# Patient Record
Sex: Female | Born: 1960 | Race: Black or African American | Hispanic: No | Marital: Married | Smoking: Never smoker
Health system: Southern US, Community
[De-identification: ages and names within clinical notes are randomized; demographics above are authoritative.]

## PROBLEM LIST (undated history)

## (undated) DIAGNOSIS — Z87442 Personal history of urinary calculi: Secondary | ICD-10-CM

## (undated) DIAGNOSIS — K219 Gastro-esophageal reflux disease without esophagitis: Secondary | ICD-10-CM

## (undated) DIAGNOSIS — I10 Essential (primary) hypertension: Secondary | ICD-10-CM

## (undated) HISTORY — PX: ABDOMINAL HYSTERECTOMY: SHX81

---

## 1996-08-13 HISTORY — PX: UVULOPALATOPHARYNGOPLASTY: SHX827

## 2002-04-17 ENCOUNTER — Ambulatory Visit (HOSPITAL_COMMUNITY): Admission: RE | Admit: 2002-04-17 | Discharge: 2002-04-17 | Payer: Self-pay | Admitting: *Deleted

## 2002-04-17 ENCOUNTER — Encounter: Payer: Self-pay | Admitting: *Deleted

## 2004-05-24 ENCOUNTER — Ambulatory Visit: Payer: Self-pay | Admitting: Internal Medicine

## 2004-05-26 ENCOUNTER — Ambulatory Visit: Payer: Self-pay | Admitting: Internal Medicine

## 2008-01-27 ENCOUNTER — Ambulatory Visit: Payer: Self-pay

## 2010-08-30 ENCOUNTER — Ambulatory Visit: Payer: Self-pay | Admitting: Obstetrics and Gynecology

## 2010-09-04 ENCOUNTER — Inpatient Hospital Stay: Payer: Self-pay | Admitting: Obstetrics and Gynecology

## 2010-09-05 LAB — PATHOLOGY REPORT

## 2010-12-16 ENCOUNTER — Emergency Department: Payer: Self-pay | Admitting: Unknown Physician Specialty

## 2015-09-22 ENCOUNTER — Other Ambulatory Visit: Payer: Self-pay | Admitting: Family Medicine

## 2015-09-22 DIAGNOSIS — Z1231 Encounter for screening mammogram for malignant neoplasm of breast: Secondary | ICD-10-CM

## 2015-10-03 ENCOUNTER — Ambulatory Visit
Admission: RE | Admit: 2015-10-03 | Discharge: 2015-10-03 | Disposition: A | Discharge status: Home / Self Care | Payer: BLUE CROSS/BLUE SHIELD | Source: Ambulatory Visit | Attending: Family Medicine | Admitting: Family Medicine

## 2015-10-03 DIAGNOSIS — Z1231 Encounter for screening mammogram for malignant neoplasm of breast: Secondary | ICD-10-CM | POA: Diagnosis present

## 2015-11-07 DIAGNOSIS — K219 Gastro-esophageal reflux disease without esophagitis: Secondary | ICD-10-CM | POA: Insufficient documentation

## 2016-01-12 ENCOUNTER — Ambulatory Visit
Admission: RE | Admit: 2016-01-12 | Payer: BLUE CROSS/BLUE SHIELD | Source: Ambulatory Visit | Admitting: Unknown Physician Specialty

## 2016-01-12 ENCOUNTER — Encounter: Admission: RE | Payer: Self-pay | Source: Ambulatory Visit

## 2016-01-12 SURGERY — COLONOSCOPY WITH PROPOFOL
Anesthesia: General

## 2016-09-24 DIAGNOSIS — K8020 Calculus of gallbladder without cholecystitis without obstruction: Secondary | ICD-10-CM | POA: Insufficient documentation

## 2016-09-24 DIAGNOSIS — I10 Essential (primary) hypertension: Secondary | ICD-10-CM | POA: Insufficient documentation

## 2016-09-28 ENCOUNTER — Other Ambulatory Visit: Payer: Self-pay | Admitting: Internal Medicine

## 2016-09-28 DIAGNOSIS — Z1231 Encounter for screening mammogram for malignant neoplasm of breast: Secondary | ICD-10-CM

## 2016-10-23 ENCOUNTER — Ambulatory Visit: Payer: BLUE CROSS/BLUE SHIELD

## 2016-11-05 ENCOUNTER — Ambulatory Visit: Payer: BLUE CROSS/BLUE SHIELD

## 2016-11-07 ENCOUNTER — Ambulatory Visit: Payer: BLUE CROSS/BLUE SHIELD

## 2016-11-28 ENCOUNTER — Ambulatory Visit
Admission: RE | Admit: 2016-11-28 | Discharge: 2016-11-28 | Disposition: A | Discharge status: Home / Self Care | Payer: BLUE CROSS/BLUE SHIELD | Source: Ambulatory Visit | Attending: Internal Medicine | Admitting: Internal Medicine

## 2016-11-28 DIAGNOSIS — Z1231 Encounter for screening mammogram for malignant neoplasm of breast: Secondary | ICD-10-CM | POA: Insufficient documentation

## 2017-04-02 DIAGNOSIS — R7309 Other abnormal glucose: Secondary | ICD-10-CM | POA: Insufficient documentation

## 2018-01-08 ENCOUNTER — Other Ambulatory Visit: Payer: Self-pay | Admitting: Internal Medicine

## 2018-01-08 DIAGNOSIS — Z1231 Encounter for screening mammogram for malignant neoplasm of breast: Secondary | ICD-10-CM

## 2018-01-16 ENCOUNTER — Ambulatory Visit
Admission: RE | Admit: 2018-01-16 | Discharge: 2018-01-16 | Disposition: A | Discharge status: Home / Self Care | Payer: BLUE CROSS/BLUE SHIELD | Source: Ambulatory Visit | Attending: Internal Medicine | Admitting: Internal Medicine

## 2018-01-16 DIAGNOSIS — Z1231 Encounter for screening mammogram for malignant neoplasm of breast: Secondary | ICD-10-CM | POA: Diagnosis not present

## 2018-04-02 ENCOUNTER — Other Ambulatory Visit: Payer: Self-pay | Admitting: Internal Medicine

## 2018-04-02 DIAGNOSIS — R1013 Epigastric pain: Secondary | ICD-10-CM

## 2018-04-02 DIAGNOSIS — Z0000 Encounter for general adult medical examination without abnormal findings: Secondary | ICD-10-CM | POA: Insufficient documentation

## 2018-04-02 DIAGNOSIS — Z9989 Dependence on other enabling machines and devices: Secondary | ICD-10-CM | POA: Insufficient documentation

## 2018-04-08 ENCOUNTER — Ambulatory Visit: Payer: BLUE CROSS/BLUE SHIELD

## 2018-04-17 ENCOUNTER — Ambulatory Visit: Payer: BLUE CROSS/BLUE SHIELD

## 2018-04-22 ENCOUNTER — Ambulatory Visit
Admission: RE | Admit: 2018-04-22 | Discharge: 2018-04-22 | Disposition: A | Discharge status: Home / Self Care | Payer: BLUE CROSS/BLUE SHIELD | Source: Ambulatory Visit | Attending: Internal Medicine | Admitting: Internal Medicine

## 2018-04-22 DIAGNOSIS — K8070 Calculus of gallbladder and bile duct without cholecystitis without obstruction: Secondary | ICD-10-CM | POA: Diagnosis not present

## 2018-04-22 DIAGNOSIS — R932 Abnormal findings on diagnostic imaging of liver and biliary tract: Secondary | ICD-10-CM | POA: Insufficient documentation

## 2018-04-22 DIAGNOSIS — R1013 Epigastric pain: Secondary | ICD-10-CM | POA: Diagnosis present

## 2018-04-23 ENCOUNTER — Other Ambulatory Visit: Payer: Self-pay

## 2018-04-24 ENCOUNTER — Other Ambulatory Visit: Payer: Self-pay

## 2018-04-24 ENCOUNTER — Telehealth: Payer: Self-pay | Admitting: Gastroenterology

## 2018-04-24 NOTE — Telephone Encounter (Signed)
Pt was told she was having surgery & now she isn't. Wants to speak to a nurse asap

## 2018-04-24 NOTE — Telephone Encounter (Signed)
Patty from [facility] needs a call regarding pt ASAP  [phone]

## 2018-04-25 ENCOUNTER — Other Ambulatory Visit
Admission: RE | Admit: 2018-04-25 | Discharge: 2018-04-25 | Disposition: A | Discharge status: Home / Self Care | Payer: BLUE CROSS/BLUE SHIELD | Source: Ambulatory Visit | Attending: Gastroenterology | Admitting: Gastroenterology

## 2018-04-25 ENCOUNTER — Other Ambulatory Visit: Payer: Self-pay

## 2018-04-25 DIAGNOSIS — K8021 Calculus of gallbladder without cholecystitis with obstruction: Secondary | ICD-10-CM | POA: Diagnosis present

## 2018-04-25 LAB — HEPATIC FUNCTION PANEL
ALT: 33 U/L (ref 0–44)
AST: 28 U/L (ref 15–41)
Albumin: 3.8 g/dL (ref 3.5–5.0)
Alkaline Phosphatase: 66 U/L (ref 38–126)
Bilirubin, Direct: <0.1 mg/dL (ref 0.0–0.2)
Total Bilirubin: 1 mg/dL (ref 0.3–1.2)
Total Protein: 7.5 g/dL (ref 6.5–8.1)

## 2018-04-28 ENCOUNTER — Ambulatory Visit
Admission: RE | Admit: 2018-04-28 | Discharge: 2018-04-28 | Disposition: A | Discharge status: Home / Self Care | Payer: BLUE CROSS/BLUE SHIELD | Source: Ambulatory Visit | Attending: Gastroenterology | Admitting: Gastroenterology

## 2018-04-28 ENCOUNTER — Encounter
Admission: RE | Disposition: A | Discharge status: Home / Self Care | Payer: Self-pay | Source: Ambulatory Visit | Attending: Gastroenterology

## 2018-04-28 ENCOUNTER — Ambulatory Visit: Payer: BLUE CROSS/BLUE SHIELD | Admitting: Anesthesiology

## 2018-04-28 ENCOUNTER — Ambulatory Visit: Payer: BLUE CROSS/BLUE SHIELD

## 2018-04-28 DIAGNOSIS — Z79899 Other long term (current) drug therapy: Secondary | ICD-10-CM | POA: Diagnosis not present

## 2018-04-28 DIAGNOSIS — I10 Essential (primary) hypertension: Secondary | ICD-10-CM | POA: Diagnosis not present

## 2018-04-28 DIAGNOSIS — Z6841 Body Mass Index (BMI) 40.0 and over, adult: Secondary | ICD-10-CM | POA: Diagnosis not present

## 2018-04-28 DIAGNOSIS — K838 Other specified diseases of biliary tract: Secondary | ICD-10-CM | POA: Diagnosis not present

## 2018-04-28 DIAGNOSIS — K8021 Calculus of gallbladder without cholecystitis with obstruction: Secondary | ICD-10-CM

## 2018-04-28 DIAGNOSIS — R932 Abnormal findings on diagnostic imaging of liver and biliary tract: Secondary | ICD-10-CM | POA: Diagnosis present

## 2018-04-28 DIAGNOSIS — Z87891 Personal history of nicotine dependence: Secondary | ICD-10-CM | POA: Insufficient documentation

## 2018-04-28 DIAGNOSIS — K219 Gastro-esophageal reflux disease without esophagitis: Secondary | ICD-10-CM | POA: Diagnosis not present

## 2018-04-28 HISTORY — PX: ERCP: SHX5425

## 2018-04-28 HISTORY — DX: Personal history of urinary calculi: Z87.442

## 2018-04-28 HISTORY — DX: Essential (primary) hypertension: I10

## 2018-04-28 HISTORY — DX: Gastro-esophageal reflux disease without esophagitis: K21.9

## 2018-04-28 SURGERY — ERCP, WITH INTERVENTION IF INDICATED
Anesthesia: General

## 2018-04-28 MED ORDER — FENTANYL CITRATE (PF) 100 MCG/2ML IJ SOLN: 25.0000 ug | INTRAMUSCULAR | Status: DC | PRN

## 2018-04-28 MED ORDER — MIDAZOLAM HCL 2 MG/2ML IJ SOLN
INTRAMUSCULAR | Status: DC | PRN
  Administered 2018-04-28: 2 mg via INTRAVENOUS

## 2018-04-28 MED ORDER — FENTANYL CITRATE (PF) 100 MCG/2ML IJ SOLN
INTRAMUSCULAR | Status: DC | PRN
  Administered 2018-04-28: 100 ug via INTRAVENOUS

## 2018-04-28 MED ORDER — LIDOCAINE HCL (CARDIAC) PF 100 MG/5ML IV SOSY
PREFILLED_SYRINGE | INTRAVENOUS | Status: DC | PRN
  Administered 2018-04-28: 100 mg via INTRAVENOUS

## 2018-04-28 MED ORDER — ONDANSETRON HCL 4 MG/2ML IJ SOLN: 4.0000 mg | Freq: Once | INTRAMUSCULAR | Status: DC | PRN

## 2018-04-28 MED ORDER — SODIUM CHLORIDE 0.9 % IV SOLN
INTRAVENOUS | Status: DC
  Administered 2018-04-28: 13:00:00 via INTRAVENOUS

## 2018-04-28 MED ORDER — GLYCOPYRROLATE 0.2 MG/ML IJ SOLN
INTRAMUSCULAR | Status: DC | PRN
  Administered 2018-04-28: 0.2 mg via INTRAVENOUS

## 2018-04-28 MED ORDER — INDOMETHACIN 50 MG RE SUPP
RECTAL | Status: AC
  Administered 2018-04-28: 100 mg via RECTAL
  Filled 2018-04-28: qty 2

## 2018-04-28 MED ORDER — MIDAZOLAM HCL 2 MG/2ML IJ SOLN
INTRAMUSCULAR | Status: AC
  Filled 2018-04-28: qty 2

## 2018-04-28 MED ORDER — INDOMETHACIN 50 MG RE SUPP
100.0000 mg | Freq: Once | RECTAL | Status: AC
  Administered 2018-04-28: 100 mg via RECTAL

## 2018-04-28 MED ORDER — ROCURONIUM BROMIDE 50 MG/5ML IV SOLN
INTRAVENOUS | Status: AC
  Filled 2018-04-28: qty 1

## 2018-04-28 MED ORDER — FENTANYL CITRATE (PF) 100 MCG/2ML IJ SOLN
INTRAMUSCULAR | Status: AC
  Filled 2018-04-28: qty 2

## 2018-04-28 MED ORDER — ROCURONIUM BROMIDE 100 MG/10ML IV SOLN
INTRAVENOUS | Status: DC | PRN
  Administered 2018-04-28: 50 mg via INTRAVENOUS

## 2018-04-28 MED ORDER — LIDOCAINE HCL (PF) 2 % IJ SOLN
INTRAMUSCULAR | Status: AC
  Filled 2018-04-28: qty 10

## 2018-04-28 MED ORDER — PROPOFOL 10 MG/ML IV BOLUS
INTRAVENOUS | Status: DC | PRN
  Administered 2018-04-28: 150 mg via INTRAVENOUS

## 2018-04-28 MED ORDER — ONDANSETRON HCL 4 MG/2ML IJ SOLN
INTRAMUSCULAR | Status: DC | PRN
  Administered 2018-04-28: 4 mg via INTRAVENOUS

## 2018-04-28 MED ORDER — SUGAMMADEX SODIUM 200 MG/2ML IV SOLN
INTRAVENOUS | Status: DC | PRN
  Administered 2018-04-28: 250 mg via INTRAVENOUS

## 2018-04-28 NOTE — Anesthesia Preprocedure Evaluation (Addendum)
Anesthesia Evaluation  Patient identified by MRN, date of birth, ID band Patient awake    Reviewed: Allergy & Precautions, NPO status , Patient's Chart, lab work & pertinent test results, reviewed documented beta blocker date and time   Airway Mallampati: II  TM Distance: >3 FB     Dental  (+) Chipped   Pulmonary           Cardiovascular hypertension,      Neuro/Psych    GI/Hepatic GERD  Controlled,  Endo/Other  Morbid obesity  Renal/GU      Musculoskeletal   Abdominal   Peds  Hematology   Anesthesia Other Findings   Reproductive/Obstetrics                            Anesthesia Physical Anesthesia Plan  ASA: III  Anesthesia Plan: General   Post-op Pain Management:    Induction: Intravenous  PONV Risk Score and Plan:   Airway Management Planned:   Additional Equipment:   Intra-op Plan:   Post-operative Plan:   Informed Consent: I have reviewed the patients History and Physical, chart, labs and discussed the procedure including the risks, benefits and alternatives for the proposed anesthesia with the patient or authorized representative who has indicated his/her understanding and acceptance.     Plan Discussed with: CRNA  Anesthesia Plan Comments:         Anesthesia Quick Evaluation

## 2018-04-28 NOTE — Transfer of Care (Signed)
Immediate Anesthesia Transfer of Care Note  Patient: Anita Wade  Procedure(s) Performed: ENDOSCOPIC RETROGRADE CHOLANGIOPANCREATOGRAPHY (ERCP) (N/A )  Patient Location: PACU  Anesthesia Type:General  Level of Consciousness: awake and patient cooperative  Airway & Oxygen Therapy: Patient Spontanous Breathing and Patient connected to nasal cannula oxygen  Post-op Assessment: Report given to RN and Post -op Vital signs reviewed and stable  Post vital signs: Reviewed and stable  Last Vitals:  Vitals Value Taken Time  BP    Temp    Pulse 100 04/28/2018  2:22 PM  Resp    SpO2 100 % 04/28/2018  2:22 PM  Vitals shown include unvalidated device data.  Last Pain:  Vitals:   04/28/18 1219  TempSrc: Tympanic  PainSc: 0-No pain         Complications: No apparent anesthesia complications

## 2018-04-28 NOTE — Interval H&P Note (Signed)
History and Physical Interval Note:  04/28/2018 2:54 PM  Milus MallickDaphne G Wade  has presented today for surgery, with the diagnosis of Common bile duct obstruction K83.1  The various methods of treatment have been discussed with the patient and family. After consideration of risks, benefits and other options for treatment, the patient has consented to  Procedure(s): ENDOSCOPIC RETROGRADE CHOLANGIOPANCREATOGRAPHY (ERCP) (N/A) as a surgical intervention .  The patient's history has been reviewed, patient examined, no change in status, stable for surgery.  I have reviewed the patient's chart and labs.  Questions were answered to the patient's satisfaction.     Darren FedExWohl

## 2018-04-28 NOTE — Anesthesia Postprocedure Evaluation (Signed)
Anesthesia Post Note  Patient: Anita MallickDaphne G Shook  Procedure(s) Performed: ENDOSCOPIC RETROGRADE CHOLANGIOPANCREATOGRAPHY (ERCP) (N/A )  Patient location during evaluation: Endoscopy Anesthesia Type: General Level of consciousness: awake and alert Pain management: pain level controlled Vital Signs Assessment: post-procedure vital signs reviewed and stable Respiratory status: spontaneous breathing, nonlabored ventilation, respiratory function stable and patient connected to nasal cannula oxygen Cardiovascular status: blood pressure returned to baseline and stable Postop Assessment: no apparent nausea or vomiting Anesthetic complications: no     Last Vitals:  Vitals:   04/28/18 1452 04/28/18 1453  BP:  (!) 152/97  Pulse: 81 76  Resp: 20 17  Temp: (!) 36.3 C   SpO2:  100%    Last Pain:  Vitals:   04/28/18 1453  TempSrc:   PainSc: 0-No pain                 THOMAS,MATHAI S

## 2018-04-28 NOTE — Anesthesia Post-op Follow-up Note (Signed)
Anesthesia QCDR form completed.        

## 2018-04-28 NOTE — Op Note (Signed)
[facility] Gastroenterology Patient Name: Anita Wade Procedure Date: 04/28/2018 1:03 PM MRN: [id number] Account #: [id number] Date of Birth: March 28, 1961 Admit Type: Outpatient Age: 57 Room: [facility] ENDO ROOM 4 Gender: Female Note Status: Finalized Procedure:            ERCP Indications:          Abnormal MRCP with 1.3 cm duct and 9mm stone. Providers:            Midge Minium MD, MD Referring MD:         Marya Amsler. Dareen Piano MD, MD (Referring MD) Medicines:            General Anesthesia Complications:        No immediate complications. Procedure:            Pre-Anesthesia Assessment:                       - Prior to the procedure, a History and Physical was                        performed, and patient medications and allergies were                        reviewed. The patient's tolerance of previous                        anesthesia was also reviewed. The risks and benefits of                        the procedure and the sedation options and risks were                        discussed with the patient. All questions were                        answered, and informed consent was obtained. Prior                        Anticoagulants: The patient has taken no previous                        anticoagulant or antiplatelet agents. ASA Grade                        Assessment: II - A patient with mild systemic disease.                        After reviewing the risks and benefits, the patient was                        deemed in satisfactory condition to undergo the                        procedure.                       After obtaining informed consent, the scope was passed                        under direct vision. Throughout the procedure, the  patient's blood pressure, pulse, and oxygen saturations                        were monitored continuously. The Duodenoscope was                        introduced through the mouth, and used to inject                    contrast into and used to inject contrast into the bile                        duct and ventral pancreatic duct. The ERCP was                        accomplished without difficulty. The patient tolerated                        the procedure well. Findings:      The scout film was normal. The esophagus was successfully intubated       under direct vision. The scope was advanced to a normal major papilla in       the descending duodenum without detailed examination of the pharynx,       larynx and associated structures, and upper GI tract. The upper GI tract       was grossly normal. The bile duct was deeply cannulated with the       short-nosed traction sphincterotome. Contrast was injected. I personally       interpreted the pancreatic duct images. There was brisk flow of contrast       through the ducts. Image quality was excellent. The entire opacified       area was normal. The bile duct was deeply cannulated with the       short-nosed traction sphincterotome. Contrast was injected. The main       bile duct was normal. A wire was passed into the biliary tree. Biliary       sphincterotomy was made with a traction (standard) sphincterotome using       ERBE electrocautery. There was no post-sphincterotomy bleeding. The       biliary tree was swept with a 15 mm balloon starting at the bifurcation.       Sludge was swept from the duct. Impression:           - A biliary sphincterotomy was performed.                       - The biliary tree was swept and sludge was found.                       - CBD 4 mm.                       - PD 2 mm. Recommendation:       - Discharge patient to home.                       - Clear liquid diet today. Procedure Code(s):    --- Professional ---                       [phone] [id number], Endoscopic retrograde cholangiopancreatography                        (  ERCP); with removal of calculi/debris from                        biliary/pancreatic duct(s)                        [phone], Endoscopic retrograde cholangiopancreatography                        (ERCP); with sphincterotomy/papillotomy                       [phone], Endoscopic catheterization of the pancreatic                        ductal system, radiological supervision and                        interpretation Diagnosis Code(s):    --- Professional ---                       R93.2, Abnormal findings on diagnostic imaging of liver                        and biliary tract CPT copyright 2017 American Medical Association. All rights reserved. The codes documented in this report are preliminary and upon coder review may  be revised to meet current compliance requirements. Midge Minium MD, MD 04/28/2018 2:08:49 PM This report has been signed electronically. Number of Addenda: 0 Note Initiated On: 04/28/2018 1:03 PM      [facility]

## 2018-04-28 NOTE — H&P (Signed)
Midge Minium, MD [facility] [address], [address] [place], [place] [address] Phone:[phone] Fax : [phone]  Primary Care Physician:  Lauro Regulus, MD Primary Gastroenterologist:  Dr. Servando Snare  Pre-Procedure History & Physical: HPI:  Anita Wade is a 57 y.o. female is here for an ERCP.   Past Medical History:  Diagnosis Date  . GERD (gastroesophageal reflux disease)   . History of kidney stones   . Hypertension     Past Surgical History:  Procedure Laterality Date  . ABDOMINAL HYSTERECTOMY     2012  . UVULOPALATOPHARYNGOPLASTY  1998    Prior to Admission medications   Medication Sig Start Date End Date Taking? Authorizing Provider  acetaminophen (TYLENOL) 650 MG CR tablet Take 650 mg by mouth every 8 (eight) hours as needed for pain.   Yes [provider]  ferrous sulfate 325 (65 FE) MG tablet Take by mouth.   Yes [provider]  lisinopril-hydrochlorothiazide (PRINZIDE,ZESTORETIC) 20-12.5 MG tablet Take by mouth. 05/17/17  Yes [provider]  Multiple Vitamin (MULTI-VITAMINS) TABS Take by mouth.   Yes [provider]  pantoprazole (PROTONIX) 40 MG tablet Take by mouth. 04/02/18 04/02/19 Yes [provider]  ranitidine (ZANTAC) 150 MG tablet Take by mouth.   Yes [provider]    Allergies as of 04/25/2018 - never reviewed  Allergen Reaction Noted  . Cheese Other (See Comments) 09/24/2016  . Shellfish allergy  11/03/2015  . Soy allergy Other (See Comments) 11/03/2015    Family History  Problem Relation Age of Onset  . Breast cancer Paternal Aunt     Social History   Socioeconomic History  . Marital status: Married    Spouse name: Not on file  . Number of children: Not on file  . Years of education: Not on file  . Highest education level: Not on file  Occupational History  . Not on file  Social Needs  . Financial resource strain: Not on file  . Food insecurity:    Worry: Not on file   Inability: Not on file  . Transportation needs:    Medical: Not on file    Non-medical: Not on file  Tobacco Use  . Smoking status: Former Games developer  . Smokeless tobacco: Never Used  Substance and Sexual Activity  . Alcohol use: Never    Frequency: Never  . Drug use: Never  . Sexual activity: Not on file  Lifestyle  . Physical activity:    Days per week: Not on file    Minutes per session: Not on file  . Stress: Not on file  Relationships  . Social connections:    Talks on phone: Not on file    Gets together: Not on file    Attends religious service: Not on file    Active member of club or organization: Not on file    Attends meetings of clubs or organizations: Not on file    Relationship status: Not on file  . Intimate partner violence:    Fear of current or ex partner: Not on file    Emotionally abused: Not on file    Physically abused: Not on file    Forced sexual activity: Not on file  Other Topics Concern  . Not on file  Social History Narrative  . Not on file    Review of Systems: See HPI, otherwise negative ROS  Physical Exam: BP (!) 148/87   Pulse 72   Temp (!) 97.4 F (36.3 C) (Tympanic)  Resp 16   Ht 4\' 10"  (1.473 m)   Wt 95.3 kg   SpO2 100%   BMI 43.89 kg/m  General:   Alert,  pleasant and cooperative in NAD Head:  Normocephalic and atraumatic. Neck:  Supple; no masses or thyromegaly. Lungs:  Clear throughout to auscultation.    Heart:  Regular rate and rhythm. Abdomen:  Soft, nontender and nondistended. Normal bowel sounds, without guarding, and without rebound.   Neurologic:  Alert and  oriented x4;  grossly normal neurologically.  Impression/Plan: [name] is here for an ERCP to be performed for bile duct stones  Risks, benefits, limitations, and alternatives regarding  ERCP have been reviewed with the patient.  Questions have been answered.  All parties agreeable.   Midge Miniumarren Mahek Schlesinger, MD  04/28/2018, 12:50 PM

## 2018-04-29 ENCOUNTER — Encounter: Payer: Self-pay | Admitting: Gastroenterology

## 2018-06-03 ENCOUNTER — Encounter: Payer: Self-pay | Admitting: Gastroenterology

## 2018-06-03 ENCOUNTER — Ambulatory Visit: Payer: BLUE CROSS/BLUE SHIELD | Admitting: Gastroenterology

## 2018-06-03 DIAGNOSIS — R1013 Epigastric pain: Secondary | ICD-10-CM

## 2018-09-23 ENCOUNTER — Other Ambulatory Visit: Payer: Self-pay

## 2018-09-23 ENCOUNTER — Ambulatory Visit
Admission: EM | Admit: 2018-09-23 | Discharge: 2018-09-23 | Disposition: A | Discharge status: Home / Self Care | Payer: BLUE CROSS/BLUE SHIELD | Attending: Family Medicine | Admitting: Family Medicine

## 2018-09-23 ENCOUNTER — Encounter: Payer: Self-pay | Admitting: Emergency Medicine

## 2018-09-23 DIAGNOSIS — R51 Headache: Secondary | ICD-10-CM | POA: Diagnosis not present

## 2018-09-23 DIAGNOSIS — R0981 Nasal congestion: Secondary | ICD-10-CM | POA: Diagnosis not present

## 2018-09-23 DIAGNOSIS — J069 Acute upper respiratory infection, unspecified: Secondary | ICD-10-CM

## 2018-09-23 DIAGNOSIS — R05 Cough: Secondary | ICD-10-CM

## 2018-09-23 DIAGNOSIS — Z87891 Personal history of nicotine dependence: Secondary | ICD-10-CM | POA: Diagnosis not present

## 2018-09-23 MED ORDER — FLUTICASONE PROPIONATE 50 MCG/ACT NA SUSP: 1.0000 | Freq: Every day | NASAL | 0 refills | Status: DC

## 2018-09-23 NOTE — Discharge Instructions (Addendum)
Take medication as prescribed. Rest. Drink plenty of fluids. Continue over the counter.  ° °Follow up with your primary care physician this week as needed. Return to Urgent care for new or worsening concerns.  ° °

## 2018-09-23 NOTE — ED Triage Notes (Signed)
Patient c/o HAs, cough, congestion and chills that started 3 days ago.  Patient denies fevers.

## 2018-09-23 NOTE — ED Provider Notes (Signed)
[facility] ____________________________________________  Time seen: Approximately 6:48 PM  I have reviewed the triage vital signs and the nursing notes.   HISTORY  Chief Complaint Cough and Headache   HPI Anita Wade is a 58 y.o. female presenting for evaluation of 3 days of nasal congestion, some intermittent ear discomfort comfort and some cough.  States had one episode of chills, but denies fevers.  States that she does not feel like she is had fevers or body aches.  States that she has had the flu before and this does not feel the same.  Did take over-the-counter Coricidin and Alka-Seltzer cold, and states that this has made her feel much better.  Denies sore throat.  Denies chest pain, shortness of breath or abdominal pain.  States her biggest complaint is continued nasal congestion.  Has continued to eat and drink well.  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  Denies other aggravating leaving factors reports otherwise doing well.  Lauro Regulus, MD: PCP    Past Medical History:  Diagnosis Date  . GERD (gastroesophageal reflux disease)   . History of kidney stones   . Hypertension     Patient Active Problem List   Diagnosis Date Noted  . Abnormal findings on imaging of biliary tract   . Ambulates with cane 04/02/2018  . Healthcare maintenance 04/02/2018  . Elevated hemoglobin A1c 04/02/2017  . Calculus of gallbladder without cholecystitis 09/24/2016  . HTN, goal below 140/90 09/24/2016  . GERD (gastroesophageal reflux disease) 11/07/2015    Past Surgical History:  Procedure Laterality Date  . ABDOMINAL HYSTERECTOMY     2012  . ERCP N/A 04/28/2018   Procedure: ENDOSCOPIC RETROGRADE CHOLANGIOPANCREATOGRAPHY (ERCP);  Surgeon: Midge Minium, MD;  Location: [facility] ENDOSCOPY;  Service: Endoscopy;  Laterality: N/A;  . UVULOPALATOPHARYNGOPLASTY  1998     No current facility-administered medications for this encounter.    Current Outpatient Medications:  .  lisinopril-hydrochlorothiazide (PRINZIDE,ZESTORETIC) 20-12.5 MG tablet, Take by mouth., Disp: , Rfl:  .  meloxicam (MOBIC) 7.5 MG tablet, Take by mouth., Disp: , Rfl:  .  Multiple Vitamin (MULTI-VITAMINS) TABS, Take by mouth., Disp: , Rfl:  .  pantoprazole (PROTONIX) 40 MG tablet, Take by mouth., Disp: , Rfl:  .  acetaminophen (TYLENOL) 650 MG CR tablet, Take 650 mg by mouth every 8 (eight) hours as needed for pain., Disp: , Rfl:  .  fluticasone (FLONASE) 50 MCG/ACT nasal spray, Place 1 spray into both nostrils daily., Disp: 1 g, Rfl: 0  Allergies Cheese; Shellfish allergy; and Soy allergy  Family History  Problem Relation Age of Onset  . Breast cancer Paternal Aunt     Social History Social History   Tobacco Use  . Smoking status: Former Games developer  . Smokeless tobacco: Never Used  Substance Use Topics  . Alcohol use: Never    Frequency: Never  . Drug use: Never    Review of Systems Constitutional: No fever ENT: No sore throat. As above.  Cardiovascular: Denies chest pain. Respiratory: Denies shortness of breath. Gastrointestinal: No abdominal pain. Musculoskeletal: Negative for back pain. Skin: Negative for rash.   ____________________________________________   PHYSICAL EXAM:  VITAL SIGNS: ED Triage Vitals  Enc Vitals Group     BP 09/23/18 1734 128/82     Pulse Rate 09/23/18 1734 83     Resp 09/23/18 1734 16     Temp 09/23/18 1734 97.8 F (36.6 C)     Temp Source 09/23/18 1734  Oral     SpO2 09/23/18 1734 99 %     Weight 09/23/18 1730 210 lb 8.6 oz (95.5 kg)     Height 09/23/18 1730 4\' 10"  (1.473 m)     Head Circumference --      Peak Flow --      Pain Score 09/23/18 1730 0     Pain Loc --      Pain Edu? --      Excl. in GC? --     Constitutional: Alert and oriented. Well appearing and in no acute distress. Eyes: Conjunctivae are normal.  Head: Atraumatic.  Minimal bilateral maxillary sinus tenderness palpation.  No  frontal sinus was palpation.  No swelling. No erythema.  Ears: no erythema, normal TMs bilaterally.   Nose:Nasal congestion with clear rhinorrhea  Mouth/Throat: Mucous membranes are moist. No pharyngeal erythema. No tonsillar swelling or exudate.  Neck: No stridor.  No cervical spine tenderness to palpation. Hematological/Lymphatic/Immunilogical: No cervical lymphadenopathy. Cardiovascular: Normal rate, regular rhythm. Grossly normal heart sounds.  Good peripheral circulation. Respiratory: Normal respiratory effort.  No retractions. No wheezes, rales or rhonchi. Good air movement.  Musculoskeletal: Ambulatory with steady gait.  Neurologic:  Normal speech and language. No gait instability. Skin:  Skin appears warm, dry and intact. No rash noted. Psychiatric: Mood and affect are normal. Speech and behavior are normal. ___________________________________________   LABS (all labs ordered are listed, but only abnormal results are displayed)  Labs Reviewed - No data to display ____________________________________   PROCEDURES Procedures    INITIAL IMPRESSION / ASSESSMENT AND PLAN / ED COURSE  Pertinent labs & imaging results that were available during my care of the patient were reviewed by me and considered in my medical decision making (see chart for details).  Well-appearing patient.  No acute distress.  Suspect viral upper respiratory infection.  Continue home supportive care and will Rx Flonase.  Encourage rest, fluids, supportive care.  Work note given for today and tomorrow.Discussed indication, risks and benefits of medications with patient.  Discussed follow up with Primary care physician this week. Discussed follow up and return parameters including no resolution or any worsening concerns. Patient verbalized understanding and agreed to plan.   ____________________________________________   FINAL CLINICAL IMPRESSION(S) / ED DIAGNOSES  Final diagnoses:  Upper respiratory  tract infection, unspecified type     ED Discharge Orders         Ordered    fluticasone (FLONASE) 50 MCG/ACT nasal spray  Daily     09/23/18 1801           Note: This dictation was prepared with Dragon dictation along with smaller phrase technology. Any transcriptional errors that result from this process are unintentional.         Renford Dills, NP 09/23/18 1850

## 2019-04-29 ENCOUNTER — Other Ambulatory Visit: Payer: Self-pay | Admitting: Internal Medicine

## 2019-04-29 DIAGNOSIS — Z1231 Encounter for screening mammogram for malignant neoplasm of breast: Secondary | ICD-10-CM

## 2019-05-06 ENCOUNTER — Inpatient Hospital Stay: Admission: RE | Admit: 2019-05-06 | Payer: BLUE CROSS/BLUE SHIELD | Source: Ambulatory Visit

## 2019-06-30 ENCOUNTER — Ambulatory Visit
Admission: RE | Admit: 2019-06-30 | Discharge: 2019-06-30 | Disposition: A | Discharge status: Home / Self Care | Payer: BLUE CROSS/BLUE SHIELD | Source: Ambulatory Visit | Attending: Internal Medicine | Admitting: Internal Medicine

## 2019-06-30 DIAGNOSIS — Z1231 Encounter for screening mammogram for malignant neoplasm of breast: Secondary | ICD-10-CM | POA: Diagnosis not present

## 2020-11-16 ENCOUNTER — Other Ambulatory Visit: Payer: Self-pay | Admitting: Internal Medicine

## 2020-11-16 DIAGNOSIS — Z1231 Encounter for screening mammogram for malignant neoplasm of breast: Secondary | ICD-10-CM

## 2020-11-29 ENCOUNTER — Ambulatory Visit
Admission: RE | Admit: 2020-11-29 | Discharge: 2020-11-29 | Disposition: A | Discharge status: Home / Self Care | Payer: 59 | Source: Ambulatory Visit | Attending: Internal Medicine | Admitting: Internal Medicine

## 2020-11-29 ENCOUNTER — Other Ambulatory Visit: Payer: Self-pay

## 2020-11-29 DIAGNOSIS — Z1231 Encounter for screening mammogram for malignant neoplasm of breast: Secondary | ICD-10-CM | POA: Diagnosis present

## 2020-12-17 ENCOUNTER — Encounter: Payer: Self-pay | Admitting: Emergency Medicine

## 2020-12-17 ENCOUNTER — Emergency Department
Admission: EM | Admit: 2020-12-17 | Discharge: 2020-12-17 | Disposition: A | Discharge status: Home / Self Care | Payer: 59 | Attending: Emergency Medicine | Admitting: Emergency Medicine

## 2020-12-17 ENCOUNTER — Emergency Department: Payer: 59

## 2020-12-17 ENCOUNTER — Other Ambulatory Visit: Payer: Self-pay

## 2020-12-17 DIAGNOSIS — E860 Dehydration: Secondary | ICD-10-CM | POA: Diagnosis not present

## 2020-12-17 DIAGNOSIS — Z79899 Other long term (current) drug therapy: Secondary | ICD-10-CM | POA: Insufficient documentation

## 2020-12-17 DIAGNOSIS — R1032 Left lower quadrant pain: Secondary | ICD-10-CM | POA: Diagnosis present

## 2020-12-17 DIAGNOSIS — Z87891 Personal history of nicotine dependence: Secondary | ICD-10-CM | POA: Diagnosis not present

## 2020-12-17 DIAGNOSIS — I10 Essential (primary) hypertension: Secondary | ICD-10-CM | POA: Insufficient documentation

## 2020-12-17 DIAGNOSIS — R112 Nausea with vomiting, unspecified: Secondary | ICD-10-CM | POA: Insufficient documentation

## 2020-12-17 LAB — COMPREHENSIVE METABOLIC PANEL
ALT: 21 U/L (ref 0–44)
AST: 25 U/L (ref 15–41)
Albumin: 3.8 g/dL (ref 3.5–5.0)
Alkaline Phosphatase: 57 U/L (ref 38–126)
Anion gap: 9 (ref 5–15)
BUN: 17 mg/dL (ref 6–20)
CO2: 23 mmol/L (ref 22–32)
Calcium: 9.3 mg/dL (ref 8.9–10.3)
Chloride: 99 mmol/L (ref 98–111)
Creatinine, Ser: 0.62 mg/dL (ref 0.44–1.00)
GFR, Estimated: >60 mL/min (ref >60)
Glucose, Bld: 127 mg/dL — ABNORMAL HIGH (ref 70–99)
Potassium: 3.2 mmol/L — ABNORMAL LOW (ref 3.5–5.1)
Sodium: 131 mmol/L — ABNORMAL LOW (ref 135–145)
Total Bilirubin: 1.2 mg/dL (ref 0.3–1.2)
Total Protein: 8 g/dL (ref 6.5–8.1)

## 2020-12-17 LAB — CBC WITH DIFFERENTIAL/PLATELET
Abs Immature Granulocytes: 0.04 10*3/uL (ref 0.00–0.07)
Basophils Absolute: 0 10*3/uL (ref 0.0–0.1)
Basophils Relative: 0 %
Eosinophils Absolute: 0 10*3/uL (ref 0.0–0.5)
Eosinophils Relative: 0 %
HCT: 39.7 % (ref 36.0–46.0)
Hemoglobin: 13.4 g/dL (ref 12.0–15.0)
Immature Granulocytes: 1 %
Lymphocytes Relative: 18 %
Lymphs Abs: 1.4 10*3/uL (ref 0.7–4.0)
MCH: 28.2 pg (ref 26.0–34.0)
MCHC: 33.8 g/dL (ref 30.0–36.0)
MCV: 83.4 fL (ref 80.0–100.0)
Monocytes Absolute: 0.5 10*3/uL (ref 0.1–1.0)
Monocytes Relative: 6 %
Neutro Abs: 5.9 10*3/uL (ref 1.7–7.7)
Neutrophils Relative %: 75 %
Platelets: 327 10*3/uL (ref 150–400)
RBC: 4.76 MIL/uL (ref 3.87–5.11)
RDW: 12.7 % (ref 11.5–15.5)
WBC: 7.9 10*3/uL (ref 4.0–10.5)
nRBC: 0 % (ref 0.0–0.2)

## 2020-12-17 LAB — URINALYSIS, COMPLETE (UACMP) WITH MICROSCOPIC
Bilirubin Urine: NEGATIVE
Glucose, UA: NEGATIVE mg/dL
Hgb urine dipstick: NEGATIVE
Ketones, ur: NEGATIVE mg/dL
Leukocytes,Ua: NEGATIVE
Nitrite: NEGATIVE
Protein, ur: NEGATIVE mg/dL
Specific Gravity, Urine: >1.046 — ABNORMAL HIGH (ref 1.005–1.030)
pH: 6 (ref 5.0–8.0)

## 2020-12-17 LAB — LIPASE, BLOOD: Lipase: 32 U/L (ref 11–51)

## 2020-12-17 MED ORDER — MORPHINE SULFATE (PF) 4 MG/ML IV SOLN
4.0000 mg | Freq: Once | INTRAVENOUS | Status: AC
Start: 2020-12-17 — End: 2020-12-17
  Administered 2020-12-17: 4 mg via INTRAVENOUS
  Filled 2020-12-17: qty 1

## 2020-12-17 MED ORDER — ONDANSETRON HCL 4 MG/2ML IJ SOLN
4.0000 mg | Freq: Once | INTRAMUSCULAR | Status: AC
  Administered 2020-12-17: 4 mg via INTRAVENOUS
  Filled 2020-12-17: qty 2

## 2020-12-17 MED ORDER — ONDANSETRON 4 MG PO TBDP: 4.0000 mg | ORAL_TABLET | Freq: Three times a day (TID) | ORAL | 0 refills | Status: DC | PRN

## 2020-12-17 MED ORDER — IOHEXOL 300 MG/ML  SOLN
100.0000 mL | Freq: Once | INTRAMUSCULAR | Status: AC | PRN
  Administered 2020-12-17: 100 mL via INTRAVENOUS

## 2020-12-17 MED ORDER — FAMOTIDINE 20 MG PO TABS: 20.0000 mg | ORAL_TABLET | Freq: Two times a day (BID) | ORAL | 0 refills | Status: AC

## 2020-12-17 MED ORDER — SODIUM CHLORIDE 0.9 % IV BOLUS
1000.0000 mL | Freq: Once | INTRAVENOUS | Status: AC
  Administered 2020-12-17: 1000 mL via INTRAVENOUS

## 2020-12-17 NOTE — ED Notes (Signed)
Pt given TV remote 

## 2020-12-17 NOTE — ED Triage Notes (Signed)
Pt reports left flank pain for the last 3 days. Pt reports was seen at [facility] and told it was more than likely a kidney stone and to continue drinking water and use a heating pad. Pt reports pain radiates around to her back now.

## 2020-12-17 NOTE — Discharge Instructions (Addendum)
Your CT scan, blood tests, and urine test were all okay.  Take famotidine to control stomach acid and take zofran as needed for nausea.

## 2020-12-17 NOTE — ED Provider Notes (Signed)
[facility] Emergency Department Provider Note  ____________________________________________  Time seen: Approximately 8:03 AM  I have reviewed the triage vital signs and the nursing notes.   HISTORY  Chief Complaint Flank Pain    HPI DANAYA GEDDIS is a 60 y.o. female with a history of hypertension and GERD who comes ED complaining of left lower quadrant abdominal pain for the last 3 days, constant, waxing and waning, worse with movement, worsening.  Now rated as severe, 10/10.  Radiates around to the back.  Denies previous pain like this in the past.  Also associated with nausea and vomiting.  No constipation or diarrhea.      Past Medical History:  Diagnosis Date  . GERD (gastroesophageal reflux disease)   . History of kidney stones   . Hypertension      Patient Active Problem List   Diagnosis Date Noted  . Abnormal findings on imaging of biliary tract   . Ambulates with cane 04/02/2018  . Healthcare maintenance 04/02/2018  . Elevated hemoglobin A1c 04/02/2017  . Calculus of gallbladder without cholecystitis 09/24/2016  . HTN, goal below 140/90 09/24/2016  . GERD (gastroesophageal reflux disease) 11/07/2015     Past Surgical History:  Procedure Laterality Date  . ABDOMINAL HYSTERECTOMY     2012  . ERCP N/A 04/28/2018   Procedure: ENDOSCOPIC RETROGRADE CHOLANGIOPANCREATOGRAPHY (ERCP);  Surgeon: Midge Minium, MD;  Location: [facility] ENDOSCOPY;  Service: Endoscopy;  Laterality: N/A;  . UVULOPALATOPHARYNGOPLASTY  1998     Prior to Admission medications   Medication Sig Start Date End Date Taking? Authorizing Provider  famotidine (PEPCID) 20 MG tablet Take 1 tablet (20 mg total) by mouth 2 (two) times daily. 12/17/20  Yes Sharman Cheek, MD  ondansetron (ZOFRAN ODT) 4 MG disintegrating tablet Take 1 tablet (4 mg total) by mouth every 8 (eight) hours as needed for nausea or vomiting. 12/17/20  Yes Sharman Cheek, MD  acetaminophen (TYLENOL)  650 MG CR tablet Take 650 mg by mouth every 8 (eight) hours as needed for pain.    [provider]  fluticasone (FLONASE) 50 MCG/ACT nasal spray Place 1 spray into both nostrils daily. 09/23/18   Renford Dills, NP  lisinopril-hydrochlorothiazide (PRINZIDE,ZESTORETIC) 20-12.5 MG tablet Take by mouth. 05/17/17   [provider]  meloxicam (MOBIC) 7.5 MG tablet Take by mouth. 05/14/18   [provider]  Multiple Vitamin (MULTI-VITAMINS) TABS Take by mouth.    [provider]  pantoprazole (PROTONIX) 40 MG tablet Take by mouth. 04/02/18 04/02/19  [provider]     Allergies Cheese, Shellfish allergy, and Soy allergy   Family History  Problem Relation Age of Onset  . Breast cancer Paternal Aunt     Social History Social History   Tobacco Use  . Smoking status: Former Games developer  . Smokeless tobacco: Never Used  Vaping Use  . Vaping Use: Never used  Substance Use Topics  . Alcohol use: Never  . Drug use: Never    Review of Systems  Constitutional:   No fever or chills.  ENT:   No sore throat. No rhinorrhea. Cardiovascular:   No chest pain or syncope. Respiratory:   No dyspnea or cough. Gastrointestinal:   Positive as above for abdominal pain and vomiting Musculoskeletal:   Negative for focal pain or swelling All other systems reviewed and are negative except as documented above in ROS and HPI.  ____________________________________________   PHYSICAL EXAM:  VITAL SIGNS: ED Triage Vitals  Enc Vitals Group  BP 12/17/20 0753 (!) 181/89     Pulse Rate 12/17/20 0753 75     Resp 12/17/20 0753 20     Temp 12/17/20 0753 98.3 F (36.8 C)     Temp Source 12/17/20 0753 Oral     SpO2 12/17/20 0753 98 %     Weight 12/17/20 0753 219 lb (99.3 kg)     Height 12/17/20 0753 4\' 11"  (1.499 m)     Head Circumference --      Peak Flow --      Pain Score 12/17/20 0746 9     Pain Loc --      Pain Edu? --      Excl. in GC? --     Vital  signs reviewed, nursing assessments reviewed.   Constitutional:   Alert and oriented.  Uncomfortable, nontoxic.02/16/21 Eyes:   Conjunctivae are normal. EOMI. PERRL. ENT      Head:   Normocephalic and atraumatic.      Nose:   Wearing a mask.      Mouth/Throat:   Wearing a mask.      Neck:   No meningismus. Full ROM. Hematological/Lymphatic/Immunilogical:   No cervical lymphadenopathy. Cardiovascular:   RRR. Symmetric bilateral radial and DP pulses.  No murmurs. Cap refill less than 2 seconds. Respiratory:   Normal respiratory effort without tachypnea/retractions. Breath sounds are clear and equal bilaterally. No wheezes/rales/rhonchi. Gastrointestinal:   Soft with pronounced left-sided abdominal tenderness. Non distended. There is no CVA tenderness.  No rebound, rigidity, or guarding. Genitourinary:   deferred Musculoskeletal:   Normal range of motion in all extremities. No joint effusions.  No lower extremity tenderness.  No edema. Neurologic:   Normal speech and language.  Motor grossly intact. No acute focal neurologic deficits are appreciated.  Skin:    Skin is warm, dry and intact. No rash noted.  No petechiae, purpura, or bullae.  ____________________________________________    LABS (pertinent positives/negatives) (all labs ordered are listed, but only abnormal results are displayed) Labs Reviewed  COMPREHENSIVE METABOLIC PANEL - Abnormal; Notable for the following components:      Result Value   Sodium 131 (*)    Potassium 3.2 (*)    Glucose, Bld 127 (*)    All other components within normal limits  URINALYSIS, COMPLETE (UACMP) WITH MICROSCOPIC - Abnormal; Notable for the following components:   Color, Urine YELLOW (*)    APPearance CLEAR (*)    Specific Gravity, Urine >1.046 (*)    Bacteria, UA RARE (*)    All other components within normal limits  LIPASE, BLOOD  CBC WITH DIFFERENTIAL/PLATELET    ____________________________________________   EKG    ____________________________________________    RADIOLOGY  CT ABDOMEN PELVIS W CONTRAST  Result Date: 12/17/2020 CLINICAL DATA:  60 year old female with concern for diverticulitis. EXAM: CT ABDOMEN AND PELVIS WITH CONTRAST TECHNIQUE: Multidetector CT imaging of the abdomen and pelvis was performed using the standard protocol following bolus administration of intravenous contrast. CONTRAST:  41 OMNIPAQUE IOHEXOL 300 MG/ML  SOLN COMPARISON:  None. FINDINGS: Lower chest: Mild bibasilar subsegmental atelectasis. No cardiomegaly. No pericardial effusion. Hepatobiliary: No focal liver abnormality is seen. Multiple gallstones are visualized without evidence of gallbladder-wall thickening, pericholecystic fluid, or intra or extrahepatic biliary ductal dilation. Pancreas: Unremarkable. No pancreatic ductal dilatation or surrounding inflammatory changes. Spleen: Normal in size without focal abnormality. Adrenals/Urinary Tract: Adrenal glands are unremarkable. Left upper pole 5.4 mm calcified renal calculus. Kidneys are otherwise normal, without renal calculi, focal lesion, or hydronephrosis.  Bladder is unremarkable. Stomach/Bowel: Small hiatal hernia. Stomach is otherwise within normal limits. Appendix appears normal. Scattered descending and sigmoid colonic diverticula without surrounding inflammatory changes. No evidence of bowel wall thickening, distention, or inflammatory changes. Vascular/Lymphatic: No significant vascular findings are present. No enlarged abdominal or pelvic lymph nodes. Reproductive: Status post hysterectomy. No adnexal masses. Other: Small fat containing supraumbilical ventral hernia without complicating features. No abdominopelvic ascites. Musculoskeletal: No acute or significant osseous findings. IMPRESSION: 1. No acute abdominopelvic abnormality. 2. Nonobstructive left upper pole nephrolithiasis. 3. Cholelithiasis without  evidence of cholecystitis. 4. Descending and sigmoid colonic diverticulosis without evidence of diverticulitis. Electronically Signed   By: Marliss Coots MD   On: 12/17/2020 10:40    ____________________________________________   PROCEDURES Procedures  ____________________________________________  DIFFERENTIAL DIAGNOSIS   Diverticulitis, bowel obstruction, cystitis/pyelonephritis, ureterolithiasis  CLINICAL IMPRESSION / ASSESSMENT AND PLAN / ED COURSE  Medications ordered in the ED: Medications  sodium chloride 0.9 % bolus 1,000 mL (0 mLs Intravenous Stopped 12/17/20 1301)  morphine 4 MG/ML injection 4 mg (4 mg Intravenous Given 12/17/20 0902)  ondansetron (ZOFRAN) injection 4 mg (4 mg Intravenous Given 12/17/20 0901)  iohexol (OMNIPAQUE) 300 MG/ML solution 100 mL (100 mLs Intravenous Contrast Given 12/17/20 1016)    Pertinent labs & imaging results that were available during my care of the patient were reviewed by me and considered in my medical decision making (see chart for details).  LASHAYE FISK was evaluated in Emergency Department on 12/17/2020 for the symptoms described in the history of present illness. She was evaluated in the context of the global COVID-19 pandemic, which necessitated consideration that the patient might be at risk for infection with the SARS-CoV-2 virus that causes COVID-19. Institutional protocols and algorithms that pertain to the evaluation of patients at risk for COVID-19 are in a state of rapid change based on information released by regulatory bodies including the CDC and federal and state organizations. These policies and algorithms were followed during the patient's care in the ED.   Patient presents with severe left lower quadrant abdominal pain, tenderness on exam.  Vital signs are normal, she is nontoxic.  High suspicion for diverticulitis.  Will obtain labs, CT scan.  Give IV fluids for hydration, IV morphine 4 mg and IV Zofran 4 mg for pain and nausea  relief   ----------------------------------------- 2:02 PM on 12/17/2020 -----------------------------------------  CT unremarkable, labs unremarkable.  Vital signs remained stable.  Pain dramatically improved.  Patient is ambulatory, voiding, feeling well and stable for discharge     ____________________________________________   FINAL CLINICAL IMPRESSION(S) / ED DIAGNOSES    Final diagnoses:  LLQ pain  Dehydration     ED Discharge Orders         Ordered    ondansetron (ZOFRAN ODT) 4 MG disintegrating tablet  Every 8 hours PRN        12/17/20 1400    famotidine (PEPCID) 20 MG tablet  2 times daily        12/17/20 1400          Portions of this note were generated with dragon dictation software. Dictation errors may occur despite best attempts at proofreading.   Sharman Cheek, MD 12/17/20 [phone]

## 2021-10-31 DIAGNOSIS — E118 Type 2 diabetes mellitus with unspecified complications: Secondary | ICD-10-CM | POA: Diagnosis not present

## 2021-10-31 DIAGNOSIS — Z6841 Body Mass Index (BMI) 40.0 and over, adult: Secondary | ICD-10-CM | POA: Diagnosis not present

## 2021-10-31 DIAGNOSIS — Z23 Encounter for immunization: Secondary | ICD-10-CM | POA: Diagnosis not present

## 2021-10-31 DIAGNOSIS — I10 Essential (primary) hypertension: Secondary | ICD-10-CM | POA: Diagnosis not present

## 2022-01-03 ENCOUNTER — Other Ambulatory Visit: Payer: Self-pay | Admitting: Internal Medicine

## 2022-01-03 DIAGNOSIS — Z1231 Encounter for screening mammogram for malignant neoplasm of breast: Secondary | ICD-10-CM

## 2022-01-05 ENCOUNTER — Ambulatory Visit
Admission: RE | Admit: 2022-01-05 | Discharge: 2022-01-05 | Disposition: A | Discharge status: Home / Self Care | Payer: 59 | Source: Ambulatory Visit | Attending: Internal Medicine | Admitting: Internal Medicine

## 2022-01-05 DIAGNOSIS — Z1231 Encounter for screening mammogram for malignant neoplasm of breast: Secondary | ICD-10-CM | POA: Diagnosis not present

## 2022-07-17 ENCOUNTER — Emergency Department: Payer: 59

## 2022-07-17 ENCOUNTER — Other Ambulatory Visit: Payer: Self-pay

## 2022-07-17 ENCOUNTER — Encounter: Payer: Self-pay | Admitting: Emergency Medicine

## 2022-07-17 ENCOUNTER — Emergency Department
Admission: EM | Admit: 2022-07-17 | Discharge: 2022-07-17 | Disposition: A | Discharge status: Home / Self Care | Payer: 59 | Attending: Emergency Medicine | Admitting: Emergency Medicine

## 2022-07-17 DIAGNOSIS — S8392XA Sprain of unspecified site of left knee, initial encounter: Secondary | ICD-10-CM | POA: Diagnosis not present

## 2022-07-17 DIAGNOSIS — S39012A Strain of muscle, fascia and tendon of lower back, initial encounter: Secondary | ICD-10-CM | POA: Diagnosis not present

## 2022-07-17 DIAGNOSIS — Y92512 Supermarket, store or market as the place of occurrence of the external cause: Secondary | ICD-10-CM | POA: Diagnosis not present

## 2022-07-17 DIAGNOSIS — S3992XA Unspecified injury of lower back, initial encounter: Secondary | ICD-10-CM | POA: Diagnosis present

## 2022-07-17 DIAGNOSIS — W19XXXA Unspecified fall, initial encounter: Secondary | ICD-10-CM | POA: Insufficient documentation

## 2022-07-17 MED ORDER — TRAMADOL HCL 50 MG PO TABS: 50.0000 mg | ORAL_TABLET | Freq: Four times a day (QID) | ORAL | 0 refills | Status: AC | PRN

## 2022-07-17 NOTE — ED Notes (Signed)
See triage note   Present s/p fall yesterday  Is having painto lower back and left knee  States she feels stiff all over

## 2022-07-17 NOTE — ED Provider Triage Note (Signed)
Emergency Medicine Provider Triage Evaluation Note  Anita Wade , a 61 y.o. female  was evaluated in triage.  Pt complains of low back pain, left knee pain after a fall last night [employer] in [place].  Woke this morning is more stiff.  Was not evaluated when she originally fell.  Review of Systems  Positive:  Negative:   Physical Exam  There were no vitals taken for this visit. Gen:   Awake, no distress   Resp:  Normal effort  MSK:   Moves extremities without difficulty  Other:    Medical Decision Making  Medically screening exam initiated at 10:20 AM.  Appropriate orders placed.  RAJA CAPUTI was informed that the remainder of the evaluation will be completed by another provider, this initial triage assessment does not replace that evaluation, and the importance of remaining in the ED until their evaluation is complete.     Faythe Ghee, PA-C 07/17/22 1020

## 2022-07-17 NOTE — ED Provider Notes (Signed)
   [facility] Provider Note    Event Date/Time   First MD Initiated Contact with Patient 07/17/22 1041     (approximate)   History   Fall   HPI  Anita Wade is a 61 y.o. female who presents after a fall which occurred yesterday.  Patient reports she fell in a store, fell backwards complains of pain in her left knee.  She does report chronic knee pain as well.  Also complains of some pain in her lower back.  Reports Tylenol and Motrin have helped significantly     Physical Exam   Triage Vital Signs: ED Triage Vitals  Enc Vitals Group     BP 07/17/22 1019 (!) 146/80     Pulse Rate 07/17/22 1019 72     Resp 07/17/22 1019 16     Temp 07/17/22 1019 98 F (36.7 C)     Temp Source 07/17/22 1019 Oral     SpO2 07/17/22 1019 98 %     Weight 07/17/22 1024 95.3 kg (210 lb)     Height 07/17/22 1024 1.499 m (4\' 11" )     Head Circumference --      Peak Flow --      Pain Score 07/17/22 1023 8     Pain Loc --      Pain Edu? --      Excl. in GC? --     Most recent vital signs: Vitals:   07/17/22 1019 07/17/22 1158  BP: (!) 146/80 (!) 140/78  Pulse: 72 70  Resp: 16 16  Temp: 98 F (36.7 C)   SpO2: 98% 98%     General: Awake, no distress.  CV:  Good peripheral perfusion.  Resp:  Normal effort.  Abd:  No distention.  Other:  No vertebral tenderness palpation.  Able to flex at the knee, some tenderness laterally to the left knee.  No significant effusion   ED Results / Procedures / Treatments   Labs (all labs ordered are listed, but only abnormal results are displayed) Labs Reviewed - No data to display   EKG     RADIOLOGY Knee x-ray viewed interpreted by me, consistent with osteoarthritis    PROCEDURES:  Critical Care performed:   Procedures   MEDICATIONS ORDERED IN ED: Medications - No data to display   IMPRESSION / MDM / ASSESSMENT AND PLAN / ED COURSE  I reviewed the triage vital signs and the nursing notes. Patient's  presentation is most consistent with acute complicated illness / injury requiring diagnostic workup.   Patient presents after a fall with knee and low back pain.  X-rays are reassuring, no compression fracture, knee x-rays consistent with osteoarthritis, discussed patient the need to follow-up with Ortho to discuss possible knee replacement given restrictions on her movements.       FINAL CLINICAL IMPRESSION(S) / ED DIAGNOSES   Final diagnoses:  Fall, initial encounter  Sprain of left knee, unspecified ligament, initial encounter  Lumbar strain, initial encounter     Rx / DC Orders   ED Discharge Orders          Ordered    traMADol (ULTRAM) 50 MG tablet  Every 6 hours PRN        07/17/22 1151             Note:  This document was prepared using Dragon voice recognition software and may include unintentional dictation errors.   14/05/23, MD 07/17/22 1600

## 2022-07-17 NOTE — ED Triage Notes (Signed)
Pt with back pain, left knee pain and stiffness s/p mechanical fall yesterday

## 2022-11-07 IMAGING — MG MM DIGITAL SCREENING BILAT W/ TOMO AND CAD
8 series · 8 of 24 positions shown · non-contrast
Comparison: Previous exam(s).

CLINICAL DATA: Screening.

EXAM:
DIGITAL SCREENING BILATERAL MAMMOGRAM WITH TOMOSYNTHESIS AND CAD
TECHNIQUE: Bilateral screening digital craniocaudal and mediolateral oblique
mammograms were obtained. Bilateral screening digital breast
tomosynthesis was performed. The images were evaluated with
computer-aided detection.

[L CC synth-2D]
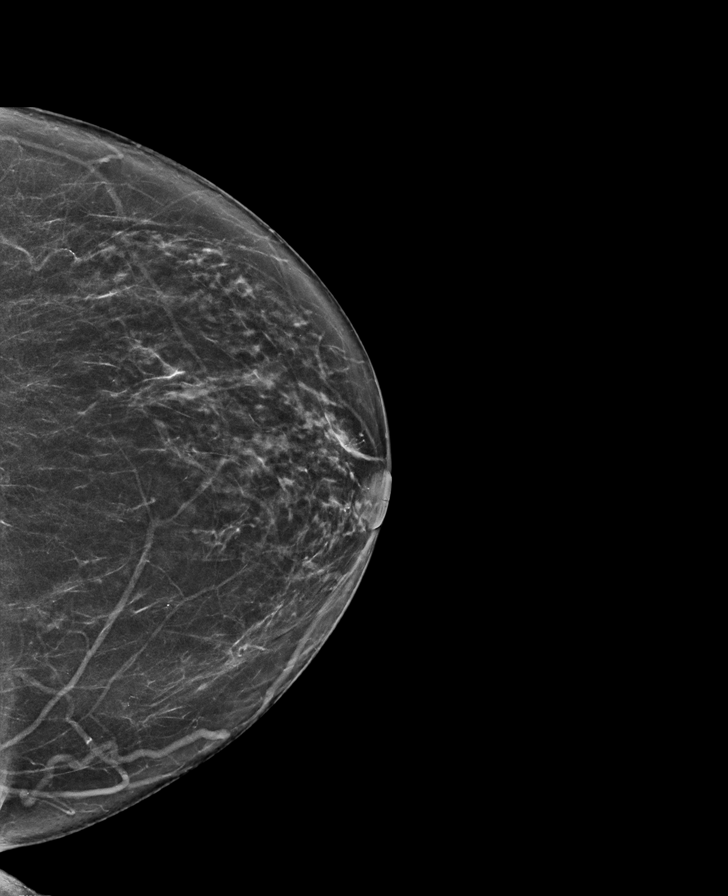

[R CC synth-2D]
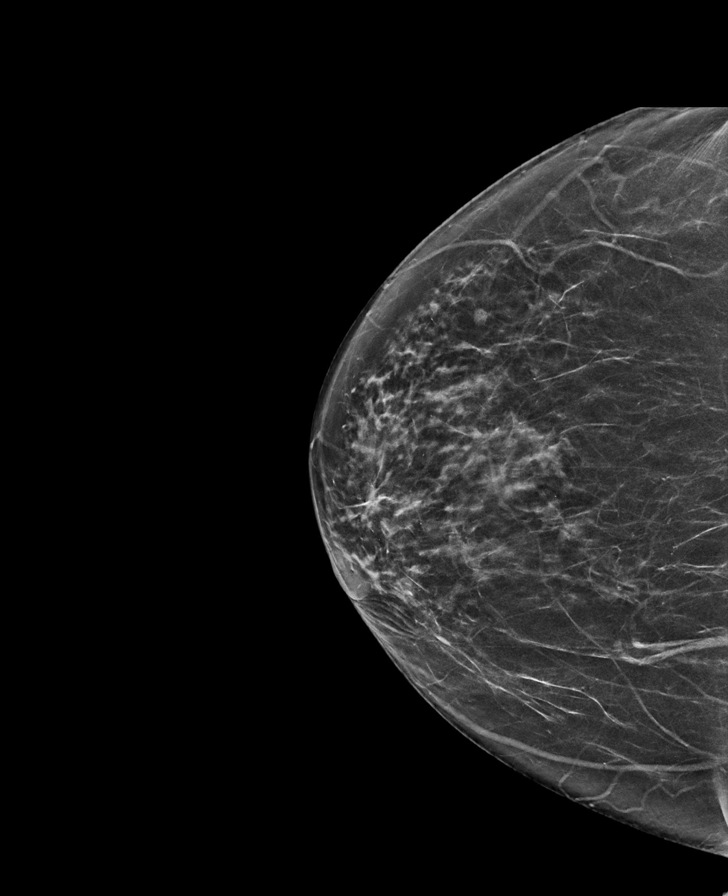

[L MLO synth-2D]
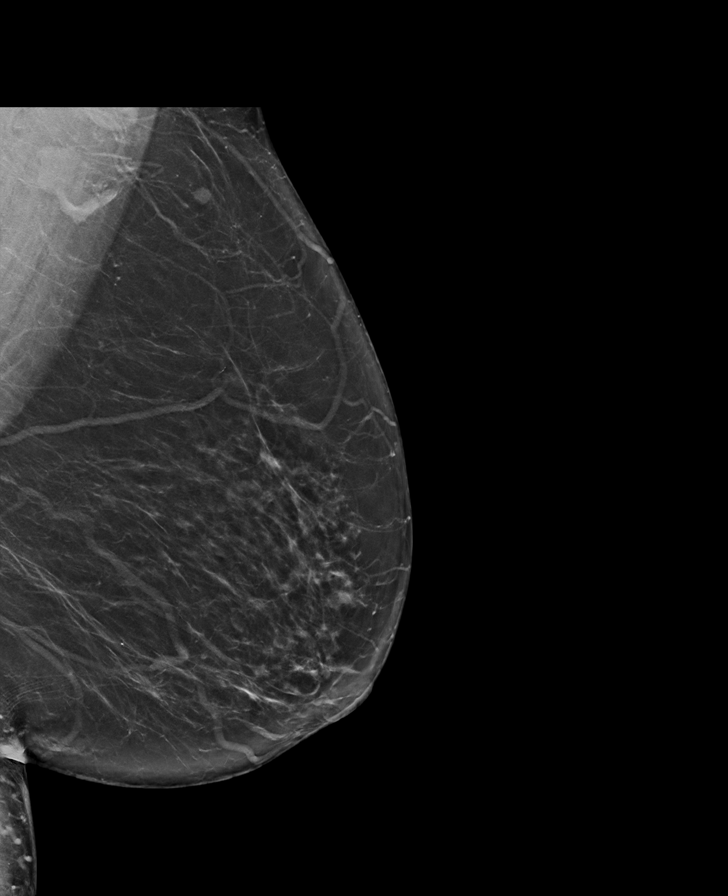

[R MLO synth-2D]
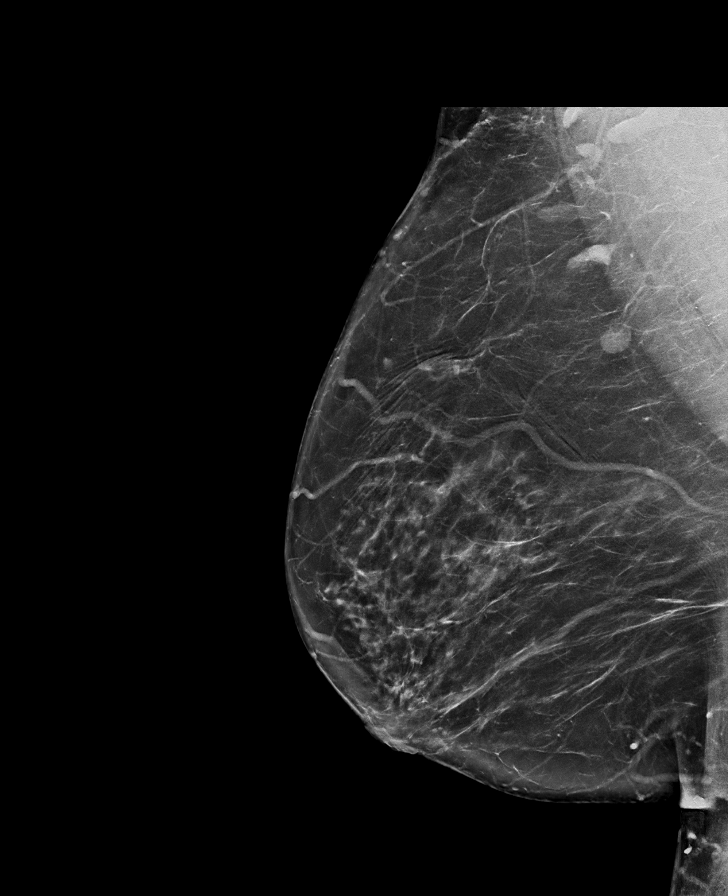

[R CC tomo · tomo slice 35/68.0]
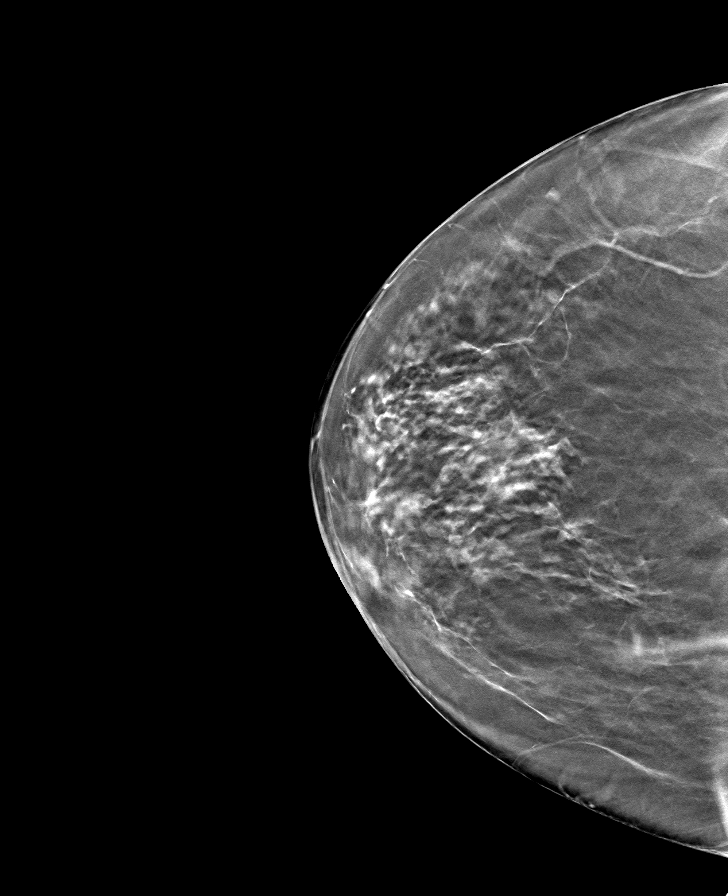

[L CC tomo · tomo slice 35/68.0]
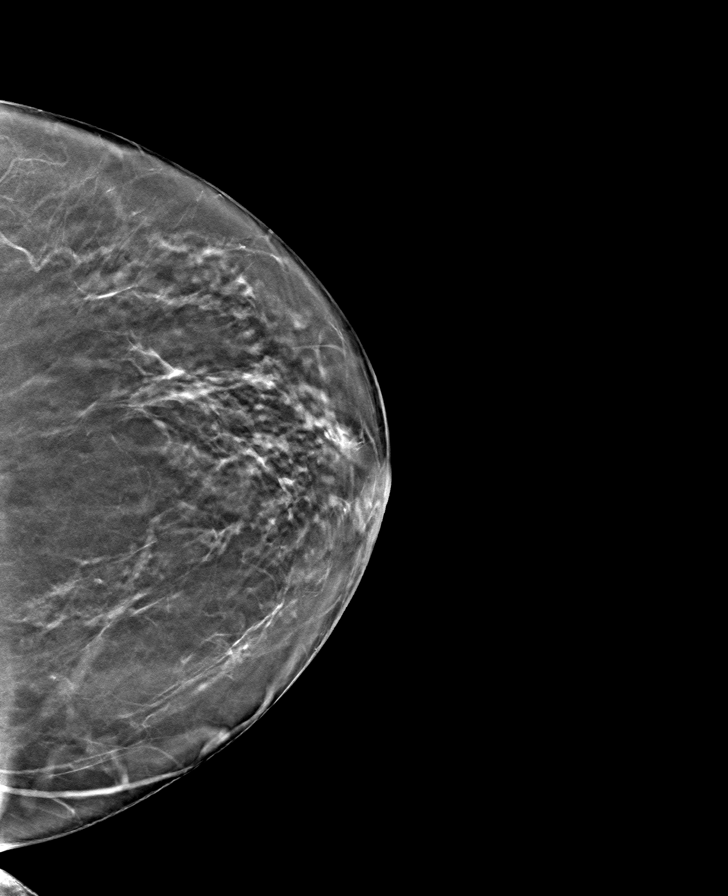

[R MLO tomo · tomo slice 43/85.0]
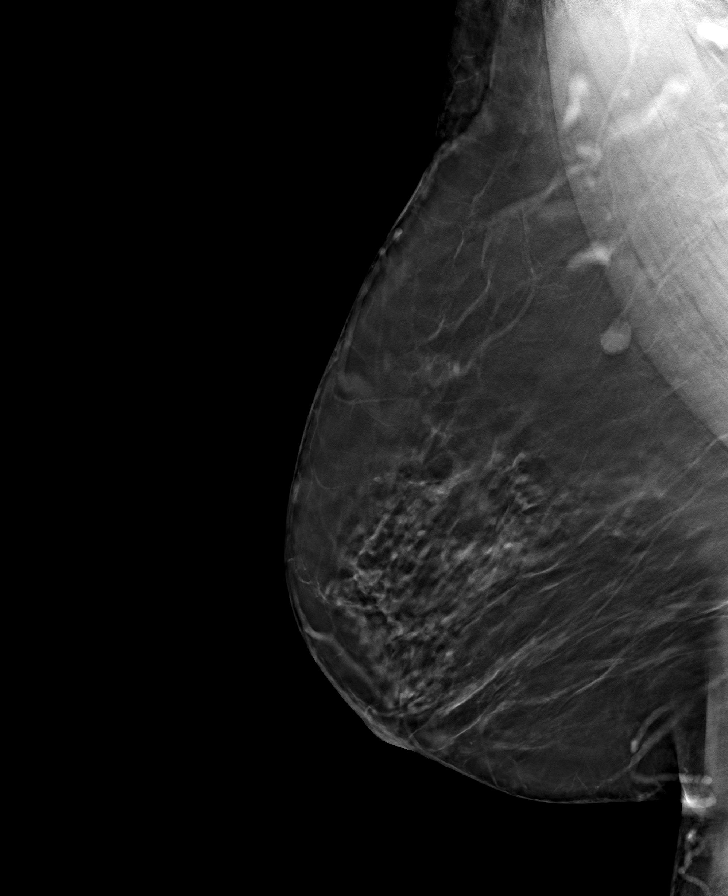

[L MLO tomo · tomo slice 40/79.0]
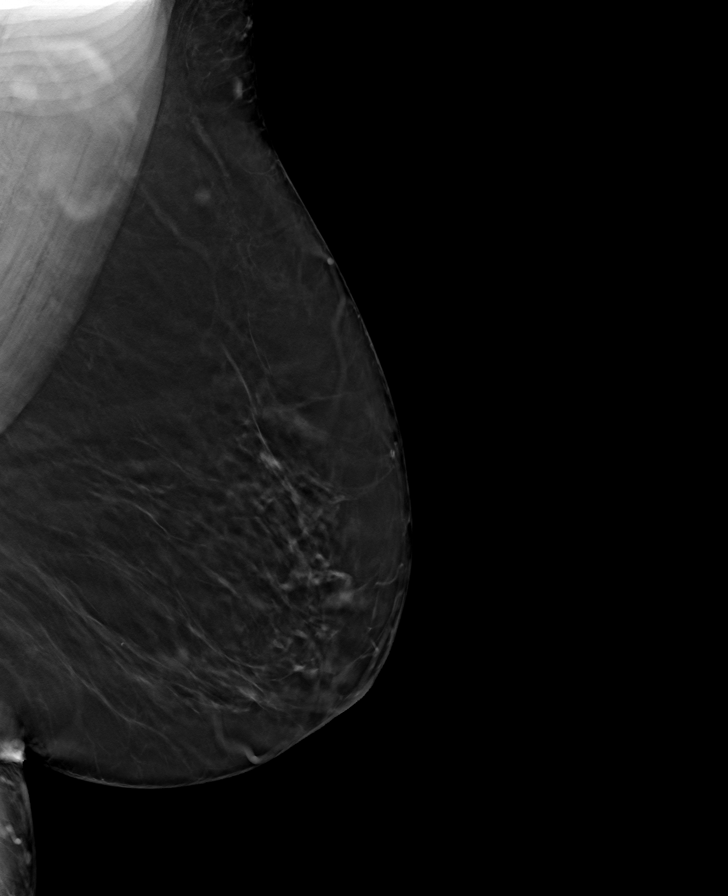

[8 of 24 positions shown; findings below may reference images not displayed]

ACR Breast Density Category b: There are scattered areas of
fibroglandular density.
FINDINGS: There are no findings suspicious for malignancy.
IMPRESSION: No mammographic evidence of malignancy. A result letter of this
screening mammogram will be mailed directly to the patient.

RECOMMENDATION:
Screening mammogram in one year. (Code:51-O-LD2)

BI-RADS CATEGORY  1: Negative.

## 2023-03-14 ENCOUNTER — Other Ambulatory Visit: Payer: Self-pay | Admitting: Internal Medicine

## 2023-03-14 DIAGNOSIS — R1013 Epigastric pain: Secondary | ICD-10-CM

## 2023-03-15 ENCOUNTER — Ambulatory Visit
Admission: RE | Admit: 2023-03-15 | Discharge: 2023-03-15 | Disposition: A | Discharge status: Home / Self Care | Payer: 59 | Source: Ambulatory Visit | Attending: Internal Medicine | Admitting: Internal Medicine

## 2023-03-15 DIAGNOSIS — R1013 Epigastric pain: Secondary | ICD-10-CM | POA: Diagnosis present

## 2023-03-16 ENCOUNTER — Emergency Department: Payer: 59

## 2023-03-16 ENCOUNTER — Other Ambulatory Visit: Payer: Self-pay

## 2023-03-16 ENCOUNTER — Observation Stay
Admission: EM | Admit: 2023-03-16 | Discharge: 2023-03-18 | Disposition: A | Discharge status: Home / Self Care | Payer: 59 | Attending: Surgery | Admitting: Surgery

## 2023-03-16 DIAGNOSIS — K5730 Diverticulosis of large intestine without perforation or abscess without bleeding: Secondary | ICD-10-CM | POA: Insufficient documentation

## 2023-03-16 DIAGNOSIS — K420 Umbilical hernia with obstruction, without gangrene: Secondary | ICD-10-CM | POA: Diagnosis not present

## 2023-03-16 DIAGNOSIS — K810 Acute cholecystitis: Secondary | ICD-10-CM | POA: Diagnosis not present

## 2023-03-16 DIAGNOSIS — K429 Umbilical hernia without obstruction or gangrene: Secondary | ICD-10-CM

## 2023-03-16 DIAGNOSIS — Z87891 Personal history of nicotine dependence: Secondary | ICD-10-CM | POA: Diagnosis not present

## 2023-03-16 DIAGNOSIS — I10 Essential (primary) hypertension: Secondary | ICD-10-CM | POA: Insufficient documentation

## 2023-03-16 DIAGNOSIS — K819 Cholecystitis, unspecified: Secondary | ICD-10-CM | POA: Diagnosis present

## 2023-03-16 DIAGNOSIS — R1011 Right upper quadrant pain: Secondary | ICD-10-CM | POA: Diagnosis present

## 2023-03-16 LAB — URINALYSIS, ROUTINE W REFLEX MICROSCOPIC
Bilirubin Urine: NEGATIVE
Glucose, UA: NEGATIVE mg/dL
Hgb urine dipstick: NEGATIVE
Ketones, ur: 5 mg/dL — AB
Nitrite: NEGATIVE
Protein, ur: 30 mg/dL — AB
Specific Gravity, Urine: 1.019 (ref 1.005–1.030)
pH: 7 (ref 5.0–8.0)

## 2023-03-16 LAB — CBC WITH DIFFERENTIAL/PLATELET
Abs Immature Granulocytes: 0.03 10*3/uL (ref 0.00–0.07)
Basophils Absolute: 0 10*3/uL (ref 0.0–0.1)
Basophils Relative: 0 %
Eosinophils Absolute: 0.1 10*3/uL (ref 0.0–0.5)
Eosinophils Relative: 1 %
HCT: 38.7 % (ref 36.0–46.0)
Hemoglobin: 12.8 g/dL (ref 12.0–15.0)
Immature Granulocytes: 0 %
Lymphocytes Relative: 24 %
Lymphs Abs: 1.8 10*3/uL (ref 0.7–4.0)
MCH: 27.9 pg (ref 26.0–34.0)
MCHC: 33.1 g/dL (ref 30.0–36.0)
MCV: 84.3 fL (ref 80.0–100.0)
Monocytes Absolute: 0.5 10*3/uL (ref 0.1–1.0)
Monocytes Relative: 7 %
Neutro Abs: 5.2 10*3/uL (ref 1.7–7.7)
Neutrophils Relative %: 68 %
Platelets: 339 10*3/uL (ref 150–400)
RBC: 4.59 MIL/uL (ref 3.87–5.11)
RDW: 13.1 % (ref 11.5–15.5)
WBC: 7.6 10*3/uL (ref 4.0–10.5)
nRBC: 0 % (ref 0.0–0.2)

## 2023-03-16 LAB — CREATININE, SERUM
Creatinine, Ser: 0.73 mg/dL (ref 0.44–1.00)
GFR, Estimated: >60 mL/min (ref >60)

## 2023-03-16 LAB — COMPREHENSIVE METABOLIC PANEL
ALT: 17 U/L (ref 0–44)
AST: 20 U/L (ref 15–41)
Albumin: 3.8 g/dL (ref 3.5–5.0)
Alkaline Phosphatase: 67 U/L (ref 38–126)
Anion gap: 8 (ref 5–15)
BUN: 19 mg/dL (ref 6–20)
CO2: 21 mmol/L — ABNORMAL LOW (ref 22–32)
Calcium: 9.5 mg/dL (ref 8.9–10.3)
Chloride: 104 mmol/L (ref 98–111)
Creatinine, Ser: 0.69 mg/dL (ref 0.44–1.00)
GFR, Estimated: >60 mL/min (ref >60)
Glucose, Bld: 139 mg/dL — ABNORMAL HIGH (ref 70–99)
Potassium: 3.2 mmol/L — ABNORMAL LOW (ref 3.5–5.1)
Sodium: 133 mmol/L — ABNORMAL LOW (ref 135–145)
Total Bilirubin: 0.7 mg/dL (ref 0.3–1.2)
Total Protein: 8.2 g/dL — ABNORMAL HIGH (ref 6.5–8.1)

## 2023-03-16 LAB — LIPASE, BLOOD: Lipase: 68 U/L — ABNORMAL HIGH (ref 11–51)

## 2023-03-16 MED ORDER — PIPERACILLIN-TAZOBACTAM 3.375 G IVPB 30 MIN
3.3750 g | Freq: Once | INTRAVENOUS | Status: AC
  Administered 2023-03-16: 3.375 g via INTRAVENOUS
  Filled 2023-03-16: qty 50

## 2023-03-16 MED ORDER — ONDANSETRON 4 MG PO TBDP: 4.0000 mg | ORAL_TABLET | Freq: Four times a day (QID) | ORAL | Status: DC | PRN

## 2023-03-16 MED ORDER — DIPHENHYDRAMINE HCL 12.5 MG/5ML PO ELIX: 12.5000 mg | ORAL_SOLUTION | Freq: Four times a day (QID) | ORAL | Status: DC | PRN

## 2023-03-16 MED ORDER — SODIUM CHLORIDE 0.9 % IV SOLN
2.0000 g | INTRAVENOUS | Status: DC
  Administered 2023-03-16 – 2023-03-17 (×2): 2 g via INTRAVENOUS
  Filled 2023-03-16 (×2): qty 20

## 2023-03-16 MED ORDER — MORPHINE SULFATE (PF) 2 MG/ML IV SOLN
2.0000 mg | INTRAVENOUS | Status: DC | PRN
  Administered 2023-03-17: 2 mg via INTRAVENOUS
  Filled 2023-03-16: qty 1

## 2023-03-16 MED ORDER — HYDRALAZINE HCL 20 MG/ML IJ SOLN: 10.0000 mg | INTRAMUSCULAR | Status: DC | PRN

## 2023-03-16 MED ORDER — MELATONIN 5 MG PO TABS
5.0000 mg | ORAL_TABLET | Freq: Every evening | ORAL | Status: DC | PRN
  Administered 2023-03-17: 5 mg via ORAL
  Filled 2023-03-16: qty 1

## 2023-03-16 MED ORDER — ONDANSETRON HCL 4 MG/2ML IJ SOLN
4.0000 mg | Freq: Four times a day (QID) | INTRAMUSCULAR | Status: DC | PRN
  Administered 2023-03-17: 4 mg via INTRAVENOUS
  Filled 2023-03-16: qty 2

## 2023-03-16 MED ORDER — OXYCODONE HCL 5 MG PO TABS
5.0000 mg | ORAL_TABLET | ORAL | Status: DC | PRN
  Administered 2023-03-17 – 2023-03-18 (×3): 10 mg via ORAL
  Filled 2023-03-16 (×3): qty 2

## 2023-03-16 MED ORDER — HEPARIN SODIUM (PORCINE) 5000 UNIT/ML IJ SOLN
5000.0000 [IU] | Freq: Three times a day (TID) | INTRAMUSCULAR | Status: DC
  Administered 2023-03-16 – 2023-03-18 (×4): 5000 [IU] via SUBCUTANEOUS
  Filled 2023-03-16 (×4): qty 1

## 2023-03-16 MED ORDER — ACETAMINOPHEN 500 MG PO TABS
1000.0000 mg | ORAL_TABLET | Freq: Four times a day (QID) | ORAL | Status: DC
  Administered 2023-03-16 – 2023-03-18 (×5): 1000 mg via ORAL
  Filled 2023-03-16 (×5): qty 2

## 2023-03-16 MED ORDER — ONDANSETRON 4 MG PO TBDP
4.0000 mg | ORAL_TABLET | Freq: Once | ORAL | Status: AC
  Administered 2023-03-16: 4 mg via ORAL
  Filled 2023-03-16: qty 1

## 2023-03-16 MED ORDER — SODIUM CHLORIDE 0.9 % IV SOLN: INTRAVENOUS | Status: DC

## 2023-03-16 MED ORDER — PANTOPRAZOLE SODIUM 40 MG IV SOLR
40.0000 mg | Freq: Two times a day (BID) | INTRAVENOUS | Status: DC
  Administered 2023-03-16 – 2023-03-18 (×4): 40 mg via INTRAVENOUS
  Filled 2023-03-16 (×4): qty 10

## 2023-03-16 MED ORDER — IOHEXOL 300 MG/ML  SOLN
100.0000 mL | Freq: Once | INTRAMUSCULAR | Status: AC | PRN
  Administered 2023-03-16: 100 mL via INTRAVENOUS

## 2023-03-16 MED ORDER — KETOROLAC TROMETHAMINE 30 MG/ML IJ SOLN: 30.0000 mg | Freq: Four times a day (QID) | INTRAMUSCULAR | Status: DC | PRN

## 2023-03-16 MED ORDER — DIPHENHYDRAMINE HCL 50 MG/ML IJ SOLN: 12.5000 mg | Freq: Four times a day (QID) | INTRAMUSCULAR | Status: DC | PRN

## 2023-03-16 MED ORDER — FENTANYL CITRATE PF 50 MCG/ML IJ SOSY
50.0000 ug | PREFILLED_SYRINGE | Freq: Once | INTRAMUSCULAR | Status: AC
  Administered 2023-03-16: 50 ug via INTRAVENOUS
  Filled 2023-03-16: qty 1

## 2023-03-16 MED ORDER — KETOROLAC TROMETHAMINE 30 MG/ML IJ SOLN
15.0000 mg | Freq: Once | INTRAMUSCULAR | Status: AC
  Administered 2023-03-16: 15 mg via INTRAVENOUS
  Filled 2023-03-16: qty 1

## 2023-03-16 NOTE — ED Triage Notes (Signed)
Patient complaining of upper abdominal pain, had outpatient [place] yesterday which showed gallstones.

## 2023-03-16 NOTE — Progress Notes (Signed)
Presents w classic sxs c/w acute cholecystitis, plan to admit, ivf, iv a/bs and rob chole + [facility] repair in am.

## 2023-03-16 NOTE — ED Notes (Signed)
Pt taken to CT via stretcher per rad tech.

## 2023-03-16 NOTE — ED Provider Notes (Signed)
[facility] Provider Note   Event Date/Time   First MD Initiated Contact with Patient 03/16/23 1926     (approximate) History  Abdominal Pain  HPI Anita Wade is a 62 y.o. female with a stated past medical history of cholelithiasis who presents for epigastric abdominal pain that has been intermittent and worsening over the last week.  Patient states that she was seen yesterday and received a right upper quadrant ultrasound only showed gallstones.  Patient states that she has been having postprandial epigastric and right upper quadrant abdominal pain with associated nausea and vomiting over this time as well.  Patient states that this pain got acutely worse over the last few hours and presented to the emergency department. ROS: Patient currently denies any vision changes, tinnitus, difficulty speaking, facial droop, sore throat, chest pain, shortness of breath, diarrhea, dysuria, or weakness/numbness/paresthesias in any extremity   Physical Exam  Triage Vital Signs: ED Triage Vitals  Encounter Vitals Group     BP 03/16/23 1656 (!) 173/94     Systolic BP Percentile --      Diastolic BP Percentile --      Pulse Rate 03/16/23 1656 70     Resp 03/16/23 1656 18     Temp 03/16/23 1656 97.8 F (36.6 C)     Temp Source 03/16/23 1656 Oral     SpO2 03/16/23 1656 99 %     Weight --      Height --      Head Circumference --      Peak Flow --      Pain Score 03/16/23 1655 10     Pain Loc --      Pain Education --      Exclude from Growth Chart --    Most recent vital signs: Vitals:   03/16/23 2100 03/16/23 2240  BP: (!) 142/71 136/84  Pulse: 79 80  Resp: 20 16  Temp:  98.2 F (36.8 C)  SpO2: 99% 96%   General: Awake, oriented x4. CV:  Good peripheral perfusion.  Resp:  Normal effort.  Abd:  No distention.  Right upper quadrant tenderness to palpation Other:  Middle-aged obese African-American female laying in bed in no acute distress ED Results /  Procedures / Treatments  Labs (all labs ordered are listed, but only abnormal results are displayed) Labs Reviewed  COMPREHENSIVE METABOLIC PANEL - Abnormal; Notable for the following components:      Result Value   Sodium 133 (*)    Potassium 3.2 (*)    CO2 21 (*)    Glucose, Bld 139 (*)    Total Protein 8.2 (*)    All other components within normal limits  LIPASE, BLOOD - Abnormal; Notable for the following components:   Lipase 68 (*)    All other components within normal limits  URINALYSIS, ROUTINE W REFLEX MICROSCOPIC - Abnormal; Notable for the following components:   Color, Urine YELLOW (*)    APPearance CLOUDY (*)    Ketones, ur 5 (*)    Protein, ur 30 (*)    Leukocytes,Ua MODERATE (*)    Bacteria, UA RARE (*)    All other components within normal limits  CBC WITH DIFFERENTIAL/PLATELET  HIV ANTIBODY (ROUTINE TESTING W REFLEX)  CREATININE, SERUM  CBC   RADIOLOGY ED MD interpretation: Right upper quadrant ultrasound shows cholelithiasis and sludge within the gallbladder with mild gallbladder wall thickening recommending: Necole correlation for possible acute cholecystitis  CT of the abdomen  and pelvis with IV contrast interpreted independently by me and shows distended gallbladder with multiple intraluminal gallstones with mild gallbladder wall thickening versus pericholecystic fluid suspicious for acute cholecystitis -Agree with radiology assessment Official radiology report(s): US ABDOMEN LIMITED RUQ (LIVER/GB)  Result Date: 03/16/2023 CLINICAL DATA:  Right upper quadrant pain EXAM: ULTRASOUND ABDOMEN LIMITED RIGHT UPPER QUADRANT COMPARISON:  CT today.  Ultrasound 03/15/2023. FINDINGS: Gallbladder: Multiple stones measuring up to 1.3 cm. Non mobile stone in the gallbladder neck. Mild wall thickening at 4 mm. Sludge seen within the gallbladder. Common bile duct: Diameter: Normal caliber, 5-6 mm. Liver: Increased echotexture compatible with fatty infiltration. No focal  abnormality or biliary ductal dilatation. Portal vein is patent on color Doppler imaging with normal direction of blood flow towards the liver. Other: None. IMPRESSION: Cholelithiasis and sludge within the gallbladder. Mild gallbladder wall thickening. Recommend clinical correlation for possible acute cholecystitis. Electronically Signed   By: Charlett Nose M.D.   On: 03/16/2023 21:31   CT ABDOMEN PELVIS W CONTRAST  Result Date: 03/16/2023 CLINICAL DATA:  Pancreatitis, acute, severe Abdominal pain, acute, nonlocalized EXAM: CT ABDOMEN AND PELVIS WITH CONTRAST TECHNIQUE: Multidetector CT imaging of the abdomen and pelvis was performed using the standard protocol following bolus administration of intravenous contrast. RADIATION DOSE REDUCTION: This exam was performed according to the departmental dose-optimization program which includes automated exposure control, adjustment of the mA and/or kV according to patient size and/or use of iterative reconstruction technique. CONTRAST:  OMNIPAQUE IOHEXOL 300 MG/ML  SOLN COMPARISON:  Ultrasound yesterday, noncontrast CT 12/17/2020 FINDINGS: Lower chest: Bandlike atelectasis in the right lower lobe. Small hiatal hernia with some irregular wall thickening of the herniated stomach. Hepatobiliary: Diffusely decreased hepatic density consistent with steatosis. Punctate hepatic granuloma. No focal liver lesion. The gallbladder is distended and contains multiple intraluminal gallstones. Mild gallbladder wall thickening versus pericholecystic fluid. No intra or extrahepatic biliary ductal dilatation Pancreas: No peripancreatic fat stranding or inflammation. No pancreatic mass or ductal dilatation. Spleen: Normal in size without focal abnormality. Adrenals/Urinary Tract: Normal adrenal glands. 5 mm nonobstructing stone in the upper pole of the left kidney. No hydronephrosis. Homogeneous renal enhancement. No suspicious renal lesion. Urinary bladder is partially distended,  normal for degree of distension. Stomach/Bowel: Small hiatal hernia irregular wall thickening of the herniated stomach. The stomach is otherwise nondistended. There is no bowel obstruction or inflammation. Normal appendix. Small to moderate colonic stool burden. Left colonic diverticulosis. No diverticulitis. Vascular/Lymphatic: Normal caliber abdominal aorta. Patent portal vein. No enlarged lymph nodes in the abdomen or pelvis. Reproductive: Hysterectomy. Left ovary is quiescent. Right ovary is not seen. No adnexal mass. Other: Moderate-sized fat containing umbilical hernia. Herniated fat extends to the left of the umbilicus. No free air. No ascites. Musculoskeletal: Mild multilevel lumbar degenerative change. There are no acute or suspicious osseous abnormalities. IMPRESSION: 1. Distended gallbladder with multiple intraluminal gallstones. Mild gallbladder wall thickening versus pericholecystic fluid. Findings are suspicious for acute cholecystitis. 2. No CT findings of acute pancreatitis. 3. Small hiatal hernia. There is wall irregularity of the herniated stomach. Recommend further assessment with endoscopy. 4. Hepatic steatosis. 5. Nonobstructing left nephrolithiasis. 6. Left colonic diverticulosis without diverticulitis. 7. Moderate-sized fat containing umbilical hernia. Electronically Signed   By: Narda Rutherford M.D.   On: 03/16/2023 20:28   PROCEDURES: Critical Care performed: Yes, see critical care procedure note(s) .1-3 Lead EKG Interpretation  Performed by: Merwyn Katos, MD Authorized by: Merwyn Katos, MD     Interpretation: normal  ECG rate:  71   ECG rate assessment: normal     Rhythm: sinus rhythm     Ectopy: none     Conduction: normal   CRITICAL CARE Performed by: Merwyn Katos  Total critical care time: 33 minutes  Critical care time was exclusive of separately billable procedures and treating other patients.  Critical care was necessary to treat or prevent imminent or  life-threatening deterioration.  Critical care was time spent personally by me on the following activities: development of treatment plan with patient and/or surrogate as well as nursing, discussions with consultants, evaluation of patient's response to treatment, examination of patient, obtaining history from patient or surrogate, ordering and performing treatments and interventions, ordering and review of laboratory studies, ordering and review of radiographic studies, pulse oximetry and re-evaluation of patient's condition.  MEDICATIONS ORDERED IN ED: Medications  heparin injection 5,000 Units (5,000 Units Subcutaneous Given 03/16/23 2337)  0.9 %  sodium chloride infusion ( Intravenous New Bag/Given 03/16/23 2323)  cefTRIAXone (ROCEPHIN) 2 g in sodium chloride 0.9 % 100 mL IVPB (2 g Intravenous New Bag/Given 03/16/23 2327)  acetaminophen (TYLENOL) tablet 1,000 mg (1,000 mg Oral Given 03/16/23 2324)  ketorolac (TORADOL) 30 MG/ML injection 30 mg (has no administration in time range)  oxyCODONE (Oxy IR/ROXICODONE) immediate release tablet 5-10 mg (has no administration in time range)  morphine (PF) 2 MG/ML injection 2 mg (has no administration in time range)  diphenhydrAMINE (BENADRYL) 12.5 MG/5ML elixir 12.5 mg (has no administration in time range)    Or  diphenhydrAMINE (BENADRYL) injection 12.5 mg (has no administration in time range)  melatonin tablet 5 mg (has no administration in time range)  ondansetron (ZOFRAN-ODT) disintegrating tablet 4 mg (has no administration in time range)    Or  ondansetron (ZOFRAN) injection 4 mg (has no administration in time range)  pantoprazole (PROTONIX) injection 40 mg (40 mg Intravenous Given 03/16/23 2324)  hydrALAZINE (APRESOLINE) injection 10 mg (has no administration in time range)  ondansetron (ZOFRAN-ODT) disintegrating tablet 4 mg (4 mg Oral Given 03/16/23 1739)  fentaNYL (SUBLIMAZE) injection 50 mcg (50 mcg Intravenous Given 03/16/23 1819)  ketorolac (TORADOL)  30 MG/ML injection 15 mg (15 mg Intravenous Given 03/16/23 2008)  iohexol (OMNIPAQUE) 300 MG/ML solution 100 mL (100 mLs Intravenous Contrast Given 03/16/23 1952)  piperacillin-tazobactam (ZOSYN) IVPB 3.375 g (3.375 g Intravenous New Bag/Given 03/16/23 2127)   IMPRESSION / MDM / ASSESSMENT AND PLAN / ED COURSE  I reviewed the triage vital signs and the nursing notes.                             The patient is on the cardiac monitor to evaluate for evidence of arrhythmia and/or significant heart rate changes. Patient's presentation is most consistent with acute presentation with potential threat to life or bodily function. Patient is a 62 year old female who presents for right upper quadrant abdominal pain History and exam AAA, pancreatitis, SBO, appendicitis, mesenteric ischemia, nephrolithiasis, pyelonephritis, or diverticulitis.  ED Interventions: analgesia PRN ED Workup: CBC, BMP, LFTs, Lipase, CT abdomen/pelvis ED Interventions: NPO with IVF ABX: Zosyn 3.375g iv  Disposition: Admit to General Surgery   FINAL CLINICAL IMPRESSION(S) / ED DIAGNOSES   Final diagnoses:  Acute cholecystitis   Rx / DC Orders   ED Discharge Orders     None      Note:  This document was prepared using Dragon voice recognition software and may include unintentional dictation errors.  Merwyn Katos, MD 03/16/23 [phone]

## 2023-03-16 NOTE — ED Notes (Signed)
Pt reports pain has increased to her epigastric area. Continues to have nausea

## 2023-03-17 ENCOUNTER — Other Ambulatory Visit: Payer: Self-pay

## 2023-03-17 ENCOUNTER — Observation Stay: Payer: 59 | Admitting: Certified Registered Nurse Anesthetist

## 2023-03-17 ENCOUNTER — Encounter
Admission: EM | Disposition: A | Discharge status: Home / Self Care | Payer: Self-pay | Source: Home / Self Care | Attending: Emergency Medicine

## 2023-03-17 DIAGNOSIS — K810 Acute cholecystitis: Secondary | ICD-10-CM | POA: Diagnosis not present

## 2023-03-17 DIAGNOSIS — K429 Umbilical hernia without obstruction or gangrene: Secondary | ICD-10-CM | POA: Diagnosis not present

## 2023-03-17 HISTORY — PX: UMBILICAL HERNIA REPAIR: SHX196

## 2023-03-17 LAB — CBC
HCT: 38.5 % (ref 36.0–46.0)
Hemoglobin: 12.7 g/dL (ref 12.0–15.0)
MCH: 27.9 pg (ref 26.0–34.0)
MCHC: 33 g/dL (ref 30.0–36.0)
MCV: 84.4 fL (ref 80.0–100.0)
Platelets: 344 10*3/uL (ref 150–400)
RBC: 4.56 MIL/uL (ref 3.87–5.11)
RDW: 13.2 % (ref 11.5–15.5)
WBC: 10.2 10*3/uL (ref 4.0–10.5)
nRBC: 0 % (ref 0.0–0.2)

## 2023-03-17 LAB — HIV ANTIBODY (ROUTINE TESTING W REFLEX): HIV Screen 4th Generation wRfx: NONREACTIVE

## 2023-03-17 SURGERY — CHOLECYSTECTOMY, ROBOT-ASSISTED, LAPAROSCOPIC
Anesthesia: General

## 2023-03-17 MED ORDER — LIDOCAINE HCL (CARDIAC) PF 100 MG/5ML IV SOSY
PREFILLED_SYRINGE | INTRAVENOUS | Status: DC | PRN
  Administered 2023-03-17: 100 mg via INTRAVENOUS

## 2023-03-17 MED ORDER — BUPIVACAINE-EPINEPHRINE (PF) 0.25% -1:200000 IJ SOLN
INTRAMUSCULAR | Status: AC
  Filled 2023-03-17: qty 30

## 2023-03-17 MED ORDER — SUGAMMADEX SODIUM 200 MG/2ML IV SOLN
INTRAVENOUS | Status: DC | PRN
  Administered 2023-03-17: 200 mg via INTRAVENOUS

## 2023-03-17 MED ORDER — BUPIVACAINE-EPINEPHRINE (PF) 0.25% -1:200000 IJ SOLN
INTRAMUSCULAR | Status: DC | PRN
  Administered 2023-03-17: 50 mL via SUBCUTANEOUS

## 2023-03-17 MED ORDER — MIDAZOLAM HCL 2 MG/2ML IJ SOLN
INTRAMUSCULAR | Status: AC
  Filled 2023-03-17: qty 2

## 2023-03-17 MED ORDER — OXYCODONE HCL 5 MG PO TABS: 5.0000 mg | ORAL_TABLET | Freq: Once | ORAL | Status: DC | PRN

## 2023-03-17 MED ORDER — ACETAMINOPHEN 10 MG/ML IV SOLN
INTRAVENOUS | Status: DC | PRN
  Administered 2023-03-17: 1000 mg via INTRAVENOUS

## 2023-03-17 MED ORDER — OXYCODONE HCL 5 MG PO TABS
5.0000 mg | ORAL_TABLET | Freq: Once | ORAL | Status: AC | PRN
  Administered 2023-03-17: 5 mg via ORAL

## 2023-03-17 MED ORDER — DROPERIDOL 2.5 MG/ML IJ SOLN
0.6250 mg | Freq: Once | INTRAMUSCULAR | Status: AC
  Administered 2023-03-17: 0.625 mg via INTRAVENOUS
  Filled 2023-03-17: qty 2

## 2023-03-17 MED ORDER — ROCURONIUM BROMIDE 10 MG/ML (PF) SYRINGE
PREFILLED_SYRINGE | INTRAVENOUS | Status: AC
  Filled 2023-03-17: qty 10

## 2023-03-17 MED ORDER — ROCURONIUM BROMIDE 100 MG/10ML IV SOLN
INTRAVENOUS | Status: DC | PRN
  Administered 2023-03-17: 70 mg via INTRAVENOUS

## 2023-03-17 MED ORDER — INDOCYANINE GREEN 25 MG IV SOLR
2.5000 mg | Freq: Once | INTRAVENOUS | Status: AC
  Administered 2023-03-17: 2.5 mg via INTRAVENOUS
  Filled 2023-03-17: qty 10

## 2023-03-17 MED ORDER — CEFAZOLIN SODIUM 1 G IJ SOLR
INTRAMUSCULAR | Status: AC
  Filled 2023-03-17: qty 10

## 2023-03-17 MED ORDER — MIDAZOLAM HCL 2 MG/2ML IJ SOLN
INTRAMUSCULAR | Status: DC | PRN
  Administered 2023-03-17: 2 mg via INTRAVENOUS

## 2023-03-17 MED ORDER — FENTANYL CITRATE (PF) 100 MCG/2ML IJ SOLN
INTRAMUSCULAR | Status: DC | PRN
  Administered 2023-03-17 (×3): 50 ug via INTRAVENOUS

## 2023-03-17 MED ORDER — OXYCODONE HCL 5 MG/5ML PO SOLN: 5.0000 mg | Freq: Once | ORAL | Status: DC | PRN

## 2023-03-17 MED ORDER — ACETAMINOPHEN 10 MG/ML IV SOLN
INTRAVENOUS | Status: AC
  Filled 2023-03-17: qty 100

## 2023-03-17 MED ORDER — FENTANYL CITRATE (PF) 100 MCG/2ML IJ SOLN
25.0000 ug | INTRAMUSCULAR | Status: DC | PRN
  Administered 2023-03-17 (×2): 25 ug via INTRAVENOUS
  Administered 2023-03-17: 50 ug via INTRAVENOUS

## 2023-03-17 MED ORDER — FENTANYL CITRATE (PF) 100 MCG/2ML IJ SOLN: 25.0000 ug | INTRAMUSCULAR | Status: DC | PRN

## 2023-03-17 MED ORDER — DEXAMETHASONE SODIUM PHOSPHATE 10 MG/ML IJ SOLN
INTRAMUSCULAR | Status: DC | PRN
  Administered 2023-03-17: 10 mg via INTRAVENOUS

## 2023-03-17 MED ORDER — DROPERIDOL 2.5 MG/ML IJ SOLN
INTRAMUSCULAR | Status: AC
  Filled 2023-03-17: qty 2

## 2023-03-17 MED ORDER — 0.9 % SODIUM CHLORIDE (POUR BTL) OPTIME
TOPICAL | Status: DC | PRN
  Administered 2023-03-17: 500 mL
  Administered 2023-03-17: 1000 mL

## 2023-03-17 MED ORDER — INDOCYANINE GREEN 25 MG IV SOLR
INTRAVENOUS | Status: AC
  Filled 2023-03-17: qty 10

## 2023-03-17 MED ORDER — FENTANYL CITRATE (PF) 100 MCG/2ML IJ SOLN
INTRAMUSCULAR | Status: AC
  Filled 2023-03-17: qty 2

## 2023-03-17 MED ORDER — ONDANSETRON HCL 4 MG/2ML IJ SOLN
INTRAMUSCULAR | Status: DC | PRN
  Administered 2023-03-17: 4 mg via INTRAVENOUS

## 2023-03-17 MED ORDER — CEFAZOLIN SODIUM-DEXTROSE 2-3 GM-%(50ML) IV SOLR
INTRAVENOUS | Status: DC | PRN
  Administered 2023-03-17: 2 g via INTRAVENOUS

## 2023-03-17 MED ORDER — OXYCODONE HCL 5 MG PO TABS
ORAL_TABLET | ORAL | Status: AC
  Filled 2023-03-17: qty 1

## 2023-03-17 MED ORDER — BUPIVACAINE LIPOSOME 1.3 % IJ SUSP
INTRAMUSCULAR | Status: AC
  Filled 2023-03-17: qty 20

## 2023-03-17 MED ORDER — OXYCODONE HCL 5 MG/5ML PO SOLN: 5.0000 mg | Freq: Once | ORAL | Status: AC | PRN

## 2023-03-17 MED ORDER — PROPOFOL 10 MG/ML IV BOLUS
INTRAVENOUS | Status: AC
  Filled 2023-03-17: qty 20

## 2023-03-17 MED ORDER — LIDOCAINE HCL (PF) 2 % IJ SOLN
INTRAMUSCULAR | Status: AC
  Filled 2023-03-17: qty 5

## 2023-03-17 MED ORDER — PROPOFOL 10 MG/ML IV BOLUS
INTRAVENOUS | Status: DC | PRN
Start: 2023-03-17 — End: 2023-03-17
  Administered 2023-03-17: 150 mg via INTRAVENOUS

## 2023-03-17 SURGICAL SUPPLY — 51 items
ADH SKN CLS APL DERMABOND .7 (GAUZE/BANDAGES/DRESSINGS) ×1
BULB RESERV EVAC DRAIN JP 100C (MISCELLANEOUS) IMPLANT
CANNULA REDUCER 12-8 DVNC XI (CANNULA) ×1 IMPLANT
CATH REDDICK CHOLANGI 4FR 50CM (CATHETERS) IMPLANT
CAUTERY HOOK MNPLR 1.6 DVNC XI (INSTRUMENTS) ×1 IMPLANT
CLIP LIGATING HEMO O LOK GREEN (MISCELLANEOUS) ×1 IMPLANT
DERMABOND ADVANCED .7 DNX12 (GAUZE/BANDAGES/DRESSINGS) ×1 IMPLANT
DRAIN CHANNEL JP 19F (MISCELLANEOUS) IMPLANT
DRAPE ARM DVNC X/XI (DISPOSABLE) ×4 IMPLANT
DRAPE COLUMN DVNC XI (DISPOSABLE) ×1 IMPLANT
ELECT CAUTERY BLADE 6.4 (BLADE) ×1 IMPLANT
ELECT REM PT RETURN 9FT ADLT (ELECTROSURGICAL) ×1
ELECTRODE REM PT RTRN 9FT ADLT (ELECTROSURGICAL) ×1 IMPLANT
FORCEPS BPLR R/ABLATION 8 DVNC (INSTRUMENTS) ×1 IMPLANT
FORCEPS PROGRASP DVNC XI (FORCEP) ×1 IMPLANT
GLOVE BIO SURGEON STRL SZ7 (GLOVE) ×2 IMPLANT
GOWN STRL REUS W/ TWL LRG LVL3 (GOWN DISPOSABLE) ×4 IMPLANT
GOWN STRL REUS W/TWL LRG LVL3 (GOWN DISPOSABLE) ×4
IRRIGATION STRYKERFLOW (MISCELLANEOUS) IMPLANT
IRRIGATOR STRYKERFLOW (MISCELLANEOUS) ×1
IV CATH ANGIO 12GX3 LT BLUE (NEEDLE) IMPLANT
KIT PINK PAD W/HEAD ARE REST (MISCELLANEOUS) ×1 IMPLANT
KIT PINK PAD W/HEAD ARM REST (MISCELLANEOUS) ×1 IMPLANT
LABEL OR SOLS (LABEL) ×1 IMPLANT
MANIFOLD NEPTUNE II (INSTRUMENTS) ×1 IMPLANT
NDL HYPO 22X1.5 SAFETY MO (MISCELLANEOUS) ×1 IMPLANT
NEEDLE HYPO 22X1.5 SAFETY MO (MISCELLANEOUS) ×1 IMPLANT
NS IRRIG 500ML POUR BTL (IV SOLUTION) ×1 IMPLANT
OBTURATOR OPTICAL STND 8 DVNC (TROCAR) ×1
OBTURATOR OPTICALSTD 8 DVNC (TROCAR) ×1 IMPLANT
PACK LAP CHOLECYSTECTOMY (MISCELLANEOUS) ×1 IMPLANT
PENCIL SMOKE EVACUATOR (MISCELLANEOUS) ×1 IMPLANT
SEAL UNIV 5-12 XI (MISCELLANEOUS) ×4 IMPLANT
SET TUBE SMOKE EVAC HIGH FLOW (TUBING) ×1 IMPLANT
SOL ELECTROSURG ANTI STICK (MISCELLANEOUS) ×1
SOLUTION ELECTROSURG ANTI STCK (MISCELLANEOUS) ×1 IMPLANT
SPIKE FLUID TRANSFER (MISCELLANEOUS) ×1 IMPLANT
SPONGE T-LAP 18X18 ~~LOC~~+RFID (SPONGE) ×1 IMPLANT
SPONGE T-LAP 4X18 ~~LOC~~+RFID (SPONGE) IMPLANT
STOPCOCK 3WAY MALE LL (IV SETS) IMPLANT
SUT ETHIBOND 0 MO6 C/R (SUTURE) IMPLANT
SUT ETHILON 3-0 FS-10 30 BLK (SUTURE) ×1
SUT MNCRL AB 4-0 PS2 18 (SUTURE) ×1 IMPLANT
SUT VICRYL 0 UR6 27IN ABS (SUTURE) ×2 IMPLANT
SUTURE EHLN 3-0 FS-10 30 BLK (SUTURE) IMPLANT
SYR 20ML LL LF (SYRINGE) IMPLANT
SYS BAG RETRIEVAL 10MM (BASKET) ×1
SYSTEM BAG RETRIEVAL 10MM (BASKET) ×1 IMPLANT
TRAP FLUID SMOKE EVACUATOR (MISCELLANEOUS) ×1 IMPLANT
WATER STERILE IRR 3000ML UROMA (IV SOLUTION) IMPLANT
WATER STERILE IRR 500ML POUR (IV SOLUTION) ×1 IMPLANT

## 2023-03-17 NOTE — Anesthesia Procedure Notes (Signed)
Procedure Name: Intubation Date/Time: 03/17/2023 11:50 AM  Performed by: Ginger Carne, CRNAPre-anesthesia Checklist: Patient identified, Emergency Drugs available, Suction available, Patient being monitored and Timeout performed Patient Re-evaluated:Patient Re-evaluated prior to induction Oxygen Delivery Method: Circle system utilized Preoxygenation: Pre-oxygenation with 100% oxygen Induction Type: IV induction Ventilation: Mask ventilation without difficulty Laryngoscope Size: McGraph and 3 Grade View: Grade I Tube type: Oral Tube size: 6.5 mm Number of attempts: 1 Airway Equipment and Method: Stylet and Video-laryngoscopy Placement Confirmation: ETT inserted through vocal cords under direct vision, positive ETCO2 and breath sounds checked- equal and bilateral Secured at: 21 cm Tube secured with: Tape Dental Injury: Teeth and Oropharynx as per pre-operative assessment

## 2023-03-17 NOTE — Anesthesia Postprocedure Evaluation (Signed)
Anesthesia Post Note  Patient: KOURTLYN CHARLET  Procedure(s) Performed: XI ROBOTIC ASSISTED LAPAROSCOPIC CHOLECYSTECTOMY HERNIA REPAIR UMBILICAL ADULT  Patient location during evaluation: PACU Anesthesia Type: General Level of consciousness: awake and alert Pain management: pain level controlled Vital Signs Assessment: post-procedure vital signs reviewed and stable Respiratory status: spontaneous breathing, nonlabored ventilation, respiratory function stable and patient connected to nasal cannula oxygen Cardiovascular status: blood pressure returned to baseline and stable Postop Assessment: no apparent nausea or vomiting Anesthetic complications: no  No notable events documented.   Last Vitals:  Vitals:   03/17/23 1430 03/17/23 1447  BP: 139/82 (!) 106/59  Pulse: 82 81  Resp: 18 17  Temp: 36.5 C 36.6 C  SpO2: 93% 97%    Last Pain:  Vitals:   03/17/23 1447  TempSrc: Oral  PainSc: 7                  Stephanie Coup

## 2023-03-17 NOTE — Op Note (Signed)
Robotic assisted laparoscopic Cholecystectomy Umbilical hernia repair 3 cms incarcerated  Pre-operative Diagnosis: acute cholecystitis, umbilical hernia  Post-operative Diagnosis: same  Procedure:  Robotic assisted laparoscopic Cholecystectomy Umbilical hernia repair 3 cms incarcerated  Surgeon: Sterling Big, MD FACS  Anesthesia: Gen. with endotracheal tube  Findings: Severe acute Cholecystitis Difficult case due to inflammation, large liver and body habitus Massive liver with hepatic steatosis  UH 3 cm incarcerated omentum  Estimated Blood Loss: 50cc       Specimens: Gallbladder           Complications: none   Procedure Details  The patient was seen again in the Holding Room. The benefits, complications, treatment options, and expected outcomes were discussed with the patient. The risks of bleeding, infection, recurrence of symptoms, failure to resolve symptoms, bile duct damage, bile duct leak, retained common bile duct stone, bowel injury, any of which could require further surgery and/or ERCP, stent, or papillotomy were reviewed with the patient. The likelihood of improving the patient's symptoms with return to their baseline status is good.  The patient and/or family concurred with the proposed plan, giving informed consent.  The patient was taken to Operating Room, identified  and the procedure verified as Laparoscopic Cholecystectomy.  A Time Out was held and the above information confirmed.  Prior to the induction of general anesthesia, antibiotic prophylaxis was administered. VTE prophylaxis was in place. General endotracheal anesthesia was then administered and tolerated well. After the induction, the abdomen was prepped with Chloraprep and draped in the sterile fashion. The patient was positioned in the supine position. Periumbilical incision was created, large umbilical hernia sac dissected free, the sac conatined incarcerated omentum an this portion was resected. Fascia  was identified and cleaned circumferentially. A Hasson trochar was placed  via the defect after two vicryl stitches were anchored to the fascia. Pneumoperitoneum was then created with CO2 and tolerated well without any adverse changes in the patient's vital signs.  Three 8-mm ports were placed under direct vision. All skin incisions  were infiltrated with a local anesthetic agent before making the incision and placing the trocars.   The patient was positioned  in reverse Trendelenburg, robot was brought to the surgical field and docked in the standard fashion.  We made sure all the instrumentation was kept indirect view at all times and that there were no collision between the arms. I scrubbed out and went to the console. Liver was massive and Gb was severely inflamed and enlarged. The gallbladder was identified, the fundus grasped and retracted cephalad. Adhesions were lysed bluntly and with a combination of the cutting setting with the hook, this required significant additional time adding significant operative time.. The infundibulum was grasped and retracted laterally, exposing the peritoneum overlying the triangle of Calot. This was then divided and exposed in a blunt fashion. An extended critical view of the cystic duct and cystic artery was obtained.  The cystic duct was clearly identified and bluntly dissected.   Artery and duct were double clipped and divided. Using ICG cholangiography we visualize the cystic duct and CBD w/o evidence of bile injuries. The gallbladder was taken from the gallbladder fossa in a retrograde fashion with the electrocautery.  Hemostasis was achieved with the electrocautery. nspection of the right upper quadrant was performed. No bleeding, bile duct injury or leak, or bowel injury was noted. Robotic instruments and robotic arms were undocked in the standard fashion.  I scrubbed back in.  The gallbladder was removed and placed in an  Endocatch bag.  19 Fr drain placed  GB fossa and secured to the abd wall. Pneumoperitoneum was released.  The periumbilical port site was closed with interrumpted 0 Vicryl sutures. 4-0 subcuticular Monocryl was used to close the skin. Dermabond was  applied.  The patient was then extubated and brought to the recovery room in stable condition. Sponge, lap, and needle counts were correct at closure and at the conclusion of the case.              Sterling Big, MD, FACS

## 2023-03-17 NOTE — H&P (Signed)
Patient ID: Anita Wade, female   DOB: 06/24/1961, 62 y.o.   MRN: [id number]  HPI Anita Wade is a 62 y.o. female presents with crescendo symptoms.  She reports that she has been having epigastric and right upper quadrant pain that has been present for the last week.  He has been increasing frequency and severity.  It is sharp moderate intermittent.  Seems to be worsening after meals.  She did have nausea and vomiting as well. She denies any fevers any chills.  She did have a CT ultrasound that I personally reviewed showing evidence of cholelithiasis with early signs of cholecystitis.  No evidence of common bile dilation. CBC and CMP is normal iother than mild hypokalemia. Prior surgical history includes abdominal hysterectomy via lower midline incision.   HPI  Past Medical History:  Diagnosis Date   GERD (gastroesophageal reflux disease)    History of kidney stones    Hypertension     Past Surgical History:  Procedure Laterality Date   ABDOMINAL HYSTERECTOMY     2012   ERCP N/A 04/28/2018   Procedure: ENDOSCOPIC RETROGRADE CHOLANGIOPANCREATOGRAPHY (ERCP);  Surgeon: Midge Minium, MD;  Location[facility] END[facility]COPY;  Service: Endoscopy;  Laterality: N/A;   UVULOPALATOPHARYNGOPLASTY  1998    Family History  Problem Relation Age of Onset   Breast cancer Paternal Aunt     Social History Social History   Tobacco Use   Smoking status: Former   Smokeless tobacco: Never  Advertising account planner   Vaping status: Never Used  Substance Use Topics   Alcohol use: Never   Drug use: Never    Allergies  Allergen Reactions   Cheese Other (See Comments)    Just Blue Cheese "throat itches, tongue swells"   Shellfish Allergy Other (See Comments)    Other reaction(s): Other (See Comments) Itchy mouth, throat swelling   Soy Allergy Other (See Comments)    Throat/mouth itch    Current Facility-Administered Medications  Medication Dose Route Frequency Provider Last Rate Last Admin   0.9 %  sodium  chloride infusion   Intravenous Continuous Leafy Ro, MD 125 mL/hr at 03/16/23 2323 New Bag at 03/16/23 2323   acetaminophen (TYLENOL) tablet 1,000 mg  1,000 mg Oral Q6H Messiah Ahr F, MD   1,000 mg at 03/17/23 4098   cefTRIAXone (ROCEPHIN) 2 g in sodium chloride 0.9 % 100 mL IVPB  2 g Intravenous Q24H Adara Kittle F, MD 200 mL/hr at 03/16/23 2327 2 g at 03/16/23 2327   diphenhydrAMINE (BENADRYL) 12.5 MG/5ML elixir 12.5 mg  12.5 mg Oral Q6H PRN Tymia Streb F, MD       Or   diphenhydrAMINE (BENADRYL) injection 12.5 mg  12.5 mg Intravenous Q6H PRN Joanna Borawski F, MD       heparin injection 5,000 Units  5,000 Units Subcutaneous Q8H Juancarlos Crescenzo, [place] F, MD   5,000 Units at 03/17/23 1191   hydrALAZINE (APRESOLINE) injection 10 mg  10 mg Intravenous Q2H PRN Marquiz Sotelo F, MD       indocyanine green (IC-GREEN) injection 2.5 mg  2.5 mg Intravenous Once Jonny Longino F, MD       ketorolac (TORADOL) 30 MG/ML injection 30 mg  30 mg Intravenous Q6H PRN Bradlee Bridgers F, MD       melatonin tablet 5 mg  5 mg Oral QHS PRN Saralee Bolick F, MD       morphine (PF) 2 MG/ML injection 2 mg  2 mg Intravenous Q3H PRN Chakita Mcgraw F,  MD       ondansetron (ZOFRAN-ODT) disintegrating tablet 4 mg  4 mg Oral Q6H PRN Margaret Staggs F, MD       Or   ondansetron (ZOFRAN) injection 4 mg  4 mg Intravenous Q6H PRN Kavan Devan F, MD       oxyCODONE (Oxy IR/ROXICODONE) immediate release tablet 5-10 mg  5-10 mg Oral Q4H PRN Deloyce Walthers F, MD       pantoprazole (PROTONIX) injection 40 mg  40 mg Intravenous Q12H Cristan Hout, Cecelia Byars F, MD   40 mg at 03/16/23 2324     Review of Systems Full ROS  was asked and was negative except for the information on the HPI  Physical Exam Blood pressure 103/70, pulse 65, temperature 98.3 F (36.8 C), temperature source Oral, resp. rate 20, height 4\' 11"  (1.499 m), weight 93.6 kg, SpO2 100%. CONSTITUTIONAL: NAD BMI 42. EYES: Pupils are equal, round, , Sclera are non-icteric. EARS, NOSE, MOUTH AND  THROAT: The oropharynx is clear. The oral mucosa is pink and moist. Hearing is intact to voice. LYMPH NODES:  Lymph nodes in the neck are normal. RESPIRATORY:  Lungs are clear. There is normal respiratory effort, with equal breath sounds bilaterally, and without pathologic use of accessory muscles. CARDIOVASCULAR: Heart is regular without murmurs, gallops, or rubs. GI: The abdomen is  soft, mildly tender to palpation RUQ without peritonitis or rebound. . There are no palpable masses. There is no hepatosplenomegaly. There are normal bowel sounds in all quadrants. Prior lower midline laparotomy scar.  Umbilical hernia that is reducible GU: Rectal deferred.   MUSCULOSKELETAL: Normal muscle strength and tone. No cyanosis or edema.   SKIN: Turgor is good and there are no pathologic skin lesions or ulcers. NEUROLOGIC: Motor and sensation is grossly normal. Cranial nerves are grossly intact. PSYCH:  Oriented to person, place and time. Affect is normal.  Data Reviewed  I have personally reviewed the patient's imaging, laboratory findings and medical records.    Assessment/Plan 62 year old female with classic biliary colic symptoms now progressing to acute cholecystitis.  This is confirmed by CT and ultrasound. We will admit, start ivf and a/bs. I do recommend prompt cholecystectomy.  Discussed with her in detail about her disease process.  She is in agreement. The risks, benefits, complications, treatment options, and expected outcomes were discussed with the patient. The possibilities of bleeding, recurrent infection, finding a normal gallbladder, perforation of viscus organs, damage to surrounding structures, bile leak, abscess formation, needing a drain placed, the need for additional procedures, reaction to medication, pulmonary aspiration,  failure to diagnose a condition, the possible need to convert to an open procedure, and creating a complication requiring transfusion or operation were discussed  with the patient. The patient and/or family concurred with the proposed plan, giving informed consent.  Regarding umbilical hernia I do think that we can address this during the same operative setting.  Discussed with her in detail about this.  Sterling Big, MD FACS General Surgeon 03/17/2023, 8:49 AM

## 2023-03-17 NOTE — Plan of Care (Signed)
  Problem: Nutrition: Goal: Adequate nutrition will be maintained Outcome: Progressing   Problem: Pain Managment: Goal: General experience of comfort will improve Outcome: Progressing   

## 2023-03-17 NOTE — Plan of Care (Signed)
  Problem: Pain Managment: Goal: General experience of comfort will improve Outcome: Progressing   

## 2023-03-17 NOTE — Anesthesia Preprocedure Evaluation (Signed)
Anesthesia Evaluation  Patient identified by MRN, date of birth, ID band Patient awake    Reviewed: Allergy & Precautions, NPO status , Patient's Chart, lab work & pertinent test results  Airway Mallampati: III  TM Distance: >3 FB Neck ROM: full    Dental  (+) Chipped, Dental Advidsory Given, Loose   Pulmonary neg pulmonary ROS, former smoker   Pulmonary exam normal        Cardiovascular hypertension, negative cardio ROS Normal cardiovascular exam     Neuro/Psych negative neurological ROS  negative psych ROS   GI/Hepatic Neg liver ROS,GERD  Medicated,,  Endo/Other  diabetes    Renal/GU      Musculoskeletal   Abdominal   Peds  Hematology negative hematology ROS (+)   Anesthesia Other Findings Front left Incisor loose.   Past Medical History: No date: GERD (gastroesophageal reflux disease) No date: History of kidney stones No date: Hypertension  Past Surgical History: No date: ABDOMINAL HYSTERECTOMY     Comment:  2012 04/28/2018: ERCP; N/A     Comment:  Procedure: ENDOSCOPIC RETROGRADE               CHOLANGIOPANCREATOGRAPHY (ERCP);  Surgeon: Midge Minium,               MD;  Location: [facility];  Service: Endoscopy;                Laterality: N/A; 1998: UVULOPALATOPHARYNGOPLASTY  BMI    Body Mass Index: 41.69 kg/m      Reproductive/Obstetrics negative OB ROS                             Anesthesia Physical Anesthesia Plan  ASA: 3  Anesthesia Plan: General ETT and General   Post-op Pain Management:    Induction: Intravenous  PONV Risk Score and Plan: 4 or greater and Ondansetron, Dexamethasone, Midazolam and Treatment may vary due to age or medical condition  Airway Management Planned: Oral ETT  Additional Equipment:   Intra-op Plan:   Post-operative Plan: Extubation in OR  Informed Consent: I have reviewed the patients History and Physical, chart, labs and  discussed the procedure including the risks, benefits and alternatives for the proposed anesthesia with the patient or authorized representative who has indicated his/her understanding and acceptance.     Dental Advisory Given  Plan Discussed with: Anesthesiologist, CRNA and Surgeon  Anesthesia Plan Comments: (Patient consented for risks of anesthesia including but not limited to:  - adverse reactions to medications - damage to eyes, teeth, lips or other oral mucosa - nerve damage due to positioning  - sore throat or hoarseness - Damage to heart, brain, nerves, lungs, other parts of body or loss of life  Patient voiced understanding.)       Anesthesia Quick Evaluation

## 2023-03-17 NOTE — Transfer of Care (Signed)
Immediate Anesthesia Transfer of Care Note  Patient: Anita Wade  Procedure(s) Performed: XI ROBOTIC ASSISTED LAPAROSCOPIC CHOLECYSTECTOMY HERNIA REPAIR UMBILICAL ADULT  Patient Location: PACU  Anesthesia Type:General  Level of Consciousness: awake and drowsy  Airway & Oxygen Therapy: Patient Spontanous Breathing and Patient connected to face mask oxygen  Post-op Assessment: Report given to RN and Post -op Vital signs reviewed and stable  Post vital signs: Reviewed and stable  Last Vitals:  Vitals Value Taken Time  BP 155/92 03/17/23 1339  Temp    Pulse 81 03/17/23 1340  Resp 15 03/17/23 1340  SpO2 100 % 03/17/23 1340  Vitals shown include unfiled device data.  Last Pain:  Vitals:   03/17/23 0519  TempSrc: Oral  PainSc:          Complications: No notable events documented.

## 2023-03-18 ENCOUNTER — Encounter: Payer: Self-pay | Admitting: Surgery

## 2023-03-18 MED ORDER — ONDANSETRON 4 MG PO TBDP: 4.0000 mg | ORAL_TABLET | Freq: Three times a day (TID) | ORAL | 0 refills | Status: AC | PRN

## 2023-03-18 MED ORDER — ONDANSETRON 4 MG PO TBDP: 4.0000 mg | ORAL_TABLET | Freq: Three times a day (TID) | ORAL | 0 refills | Status: DC | PRN

## 2023-03-18 MED ORDER — AMOXICILLIN-POT CLAVULANATE 875-125 MG PO TABS: 1.0000 | ORAL_TABLET | Freq: Two times a day (BID) | ORAL | 0 refills | Status: AC

## 2023-03-18 MED ORDER — OXYCODONE HCL 5 MG PO TABS: 5.0000 mg | ORAL_TABLET | Freq: Four times a day (QID) | ORAL | 0 refills | Status: DC | PRN

## 2023-03-18 NOTE — Discharge Summary (Signed)
[facility] SURGICAL DISCHARGE SUMMARY   Patient ID: Anita Wade MRN: [id number] DOB/AGE: [birth year] 62 y.o.  Admit date: 03/16/2023 Discharge date: 03/18/2023  Discharge Diagnoses Patient Active Problem List   Diagnosis Date Noted   Umbilical hernia without obstruction and without gangrene 03/17/2023   Acute cholecystitis 03/16/2023    Consultants None  Procedures 03/17/2023:  Robotic assisted laparoscopic cholecystectomy  Umbilical hernia repair   HPI: Anita Wade is a 62 y.o. female presents with crescendo symptoms.  She reports that she has been having epigastric and right upper quadrant pain that has been present for the last week.  He has been increasing frequency and severity.  It is sharp moderate intermittent.  Seems to be worsening after meals.  She did have nausea and vomiting as well. She denies any fevers any chills.  She did have a CT ultrasound that I personally reviewed showing evidence of cholelithiasis with early signs of cholecystitis.  No evidence of common bile dilation. CBC and CMP is normal iother than mild hypokalemia. Prior surgical history includes abdominal hysterectomy via lower midline incision.  Hospital Course: Informed consent was obtained and documented, and patient underwent uneventful robotic assisted laparoscopic cholecystectomy and umbilical hernia repair (Dr Everlene Farrier, 03/17/2023).  Post-operatively, patient's pain/symptoms improved/resolved and advancement of patient's diet and ambulation were well-tolerated. The remainder of patient's hospital course was essentially unremarkable, and discharge planning was initiated accordingly with patient safely able to be discharged home with appropriate discharge instructions, antibiotics (Augmentin x7 days), pain control, and outpatient follow-up after all of her questions were answered to her expressed satisfaction.   Discharge Condition: Good    Physical Examination:  Constitutional:  Well appearing female, NAD Pulmonary: Normal effort, no respiratory distress Gastrointestinal: Soft, incisional soreness, non-distended, no rebound/guarding. Surgical drain in right lateral port; output serosanguinous  Skin: Laparoscopic incisions are CDI with dermabond, no erythema   Allergies as of 03/18/2023       Reactions   Cheese Other (See Comments)   Just Blue Cheese "throat itches, tongue swells"   Shellfish Allergy Other (See Comments)   Other reaction(s): Other (See Comments) Itchy mouth, throat swelling   Soy Allergy Other (See Comments)   Throat/mouth itch        Medication List     TAKE these medications    acetaminophen 650 MG CR tablet Commonly known as: TYLENOL Take 650 mg by mouth every 8 (eight) hours as needed for pain.   amoxicillin-clavulanate 875-125 MG tablet Commonly known as: AUGMENTIN Take 1 tablet by mouth 2 (two) times daily for 7 days.   famotidine 20 MG tablet Commonly known as: PEPCID Take 1 tablet (20 mg total) by mouth 2 (two) times daily.   fluticasone 50 MCG/ACT nasal spray Commonly known as: FLONASE Place 1 spray into both nostrils daily. What changed:  when to take this reasons to take this   gabapentin 300 MG capsule Commonly known as: NEURONTIN Take 1 capsule by mouth 2 (two) times daily.   ibuprofen 800 MG tablet Commonly known as: ADVIL Take 1 tablet by mouth every 8 (eight) hours as needed for moderate pain.   lisinopril-hydrochlorothiazide 20-12.5 MG tablet Commonly known as: ZESTORETIC Take 1 tablet by mouth daily.   Multi-Vitamins Tabs Take by mouth.   ondansetron 4 MG disintegrating tablet Commonly known as: Zofran ODT Take 1 tablet (4 mg total) by mouth every 8 (eight) hours as needed for nausea or vomiting.   oxyCODONE 5 MG immediate release tablet Commonly known as:  Oxy IR/ROXICODONE Take 1 tablet (5 mg total) by mouth every 6 (six) hours as needed for severe pain or breakthrough pain.   pantoprazole  40 MG tablet Commonly known as: PROTONIX Take 40 mg by mouth daily.   QC Tumeric Complex 500 MG Caps Generic drug: Turmeric Take 1,000 mg by mouth daily.   traMADol 50 MG tablet Commonly known as: Ultram Take 1 tablet (50 mg total) by mouth every 6 (six) hours as needed.          Follow-up Information     Donovan Kail, PA-C. Schedule an appointment as soon as possible for a visit in 1 week(s).   Specialty: Physician Assistant Why: s/p lap cholecystectomy, with drain Contact information: [address] [place] [address] [phone]                  Time spent on discharge management including discussion of hospital course, clinical condition, outpatient instructions, prescriptions, and follow up with the patient and members of the medical team: >30 minutes  -- [name] , PA-C [facility]  03/18/2023, 8:59 AM [phone] M-F: 7am - 4pm

## 2023-03-18 NOTE — Discharge Instructions (Signed)
In addition to included general post-operative instructions,  Diet: Resume home diet. Recommend avoiding or limiting fatty/greasy foods over the next few days/week. If you do eat these, you may (or may not) notice diarrhea. This is expected while your body adjusts to not having a gallbladder, and it typically resolves with time.    Activity: No heavy lifting >20 pounds (children, pets, laundry, garbage) or strenuous activity for 4 weeks, but light activity and walking are encouraged. Do not drive or drink alcohol if taking narcotic pain medications or having pain that might distract from driving.  Drain: Monitor and record drain output daily; hand out given. Please bring this with you to your follow up appointment.   Wound care: If you can keep drain site covered, 2 days after surgery (08/06), you may shower/get incision wet with soapy water and pat dry (do not rub incisions), but no baths or submerging incision underwater until follow-up.   Medications: Resume all home medications. For mild to moderate pain: acetaminophen (Tylenol) or ibuprofen/naproxen (if no kidney disease). Combining Tylenol with alcohol can substantially increase your risk of causing liver disease. Narcotic pain medications, if prescribed, can be used for severe pain, though may cause nausea, constipation, and drowsiness. Do not combine Tylenol and Percocet (or similar) within a 6 hour period as Percocet (and similar) contain(s) Tylenol. If you do not need the narcotic pain medication, you do not need to fill the prescription.  Call office [phone] / [phone]) at any time if any questions, worsening pain, fevers/chills, bleeding, drainage from incision site, or other concerns.

## 2023-03-18 NOTE — TOC CM/SW Note (Signed)
Transition of Care [facility]) - Inpatient Brief Assessment   Patient Details  Name: Anita Wade MRN: [id number] Date of Birth: 30-Jan-1961  Transition of [facility]) CM/SW Contact:    Margarito Liner, LCSW Phone Number: 03/18/2023, 9:10 AM   Clinical Narrative: Patient has orders to discharge home today. Chart reviewed. No TOC needs identified. CSW signing off.  Transition of Care Asessment: Insurance and Status: Insurance coverage has been reviewed Patient has primary care physician: Yes Home environment has been reviewed: Single family home Prior level of function:: Not documented. Prior/Current Home Services: No current home services Social Determinants of Health Reivew: SDOH reviewed no interventions necessary Readmission risk has been reviewed: Yes Transition of care needs: no transition of care needs at this time

## 2023-03-18 NOTE — Progress Notes (Signed)
Discharge education provided including reviewing AVS and activity/return instructions, as well as reviewing nurse hotline number on AVS.   Discharge teaching included talking patient through emptying and recharging her drain, patient performed return demonstration and teachback. Materials provided for dressing changes and measuring output; output measurement sheet reviewed.  All questions answered and IV removed; patient's husband and brother at bedside and will ring when she is ready to be wheeled down.

## 2023-03-26 ENCOUNTER — Ambulatory Visit (INDEPENDENT_AMBULATORY_CARE_PROVIDER_SITE_OTHER): Payer: 59 | Admitting: Physician Assistant

## 2023-03-26 ENCOUNTER — Encounter: Payer: Self-pay | Admitting: Physician Assistant

## 2023-03-26 VITALS — BP 148/80 | HR 82 | Temp 98.3°F | Ht 59.0 in | Wt 210.0 lb

## 2023-03-26 DIAGNOSIS — K810 Acute cholecystitis: Secondary | ICD-10-CM

## 2023-03-26 DIAGNOSIS — Z09 Encounter for follow-up examination after completed treatment for conditions other than malignant neoplasm: Secondary | ICD-10-CM

## 2023-03-26 DIAGNOSIS — K8021 Calculus of gallbladder without cholecystitis with obstruction: Secondary | ICD-10-CM

## 2023-03-26 NOTE — Progress Notes (Signed)
[place] [facility] POST-OP OFFICE VISIT  03/26/2023  HPI: Anita Wade is a 62 y.o. female 9 days s/p robotic assisted laparoscopic cholecystectomy for acute cholecystitis as well as umbilical hernia repair with Dr Everlene Farrier  She is overall doing well Soreness remains at umbilical incision No fever, chills, nausea, emesis Drain with 30 ccs or less daily; serous Incisions otherwise healing well She also notes swelling intermittently in her lower extremities; new since surgery, worse at the end of the day. No cough, CP, SOB.  Ambulating at her baseline  Vital signs: BP (!) 148/80   Pulse 82   Temp 98.3 F (36.8 C)   Ht 4\' 11"  (1.499 m)   Wt 210 lb (95.3 kg)   SpO2 98%   BMI 42.41 kg/m    Physical Exam: Constitutional: Well appearing female, NAD Abdomen: Soft, incisional soreness particularly at umbilicus, non-distended, no rebound/guarding. Surgical drain in right lateral port site; output serous (removed) Skin: Laparoscopic incisions are healing well, no erythema or drainage  MSK: She does have 1+ pitting edema to the bilateral LE, no calf pain, no erythema   Assessment/Plan: This is a 62 y.o. female 9 days s/p robotic assisted laparoscopic cholecystectomy for acute cholecystitis as well as umbilical hernia repair with Dr Everlene Farrier   - Encourage patient to monitor lower extremity swelling and follow up with PCP for this. May be sequela of resuscitation from surgery/hospitalization. No signs or symptoms otherwise of CHF. Encouraged elevation +/- compression stockings.  - Surgical drain removed without issue; occlusive dressing placed   - Pain control prn  - Reviewed wound care recommendation  - Reviewed lifting restrictions; 6 weeks total  - Reviewed surgical pathology; CCC  - I will see her again on 08/29; She understands to call with questions/concerns in the interim  -- [name], PA-C [facility] 03/26/2023, 3:10 PM M-F: 7am - 4pm

## 2023-04-08 ENCOUNTER — Telehealth: Payer: Self-pay | Admitting: *Deleted

## 2023-04-08 NOTE — Telephone Encounter (Signed)
Faxed Disability to [employer] and [employer] [phone]

## 2023-04-11 ENCOUNTER — Encounter: Payer: 59 | Admitting: Physician Assistant

## 2023-04-18 ENCOUNTER — Encounter: Payer: Self-pay | Admitting: Physician Assistant

## 2023-04-18 ENCOUNTER — Ambulatory Visit (INDEPENDENT_AMBULATORY_CARE_PROVIDER_SITE_OTHER): Payer: 59 | Admitting: Physician Assistant

## 2023-04-18 VITALS — BP 141/73 | HR 87 | Temp 98.5°F | Ht 59.0 in | Wt 204.0 lb

## 2023-04-18 DIAGNOSIS — Z09 Encounter for follow-up examination after completed treatment for conditions other than malignant neoplasm: Secondary | ICD-10-CM

## 2023-04-18 DIAGNOSIS — K8021 Calculus of gallbladder without cholecystitis with obstruction: Secondary | ICD-10-CM

## 2023-04-18 DIAGNOSIS — K811 Chronic cholecystitis: Secondary | ICD-10-CM

## 2023-04-18 NOTE — Progress Notes (Signed)
[facility] [facility] POST-OP OFFICE VISIT  04/18/2023  HPI: Anita Wade is a 62 y.o. female ~1 month s/p robotic assisted laparoscopic cholecystectomy for acute cholecystitis as well as umbilical hernia repair with Dr Everlene Farrier   She is doing well No abdominal pain, fever, chills, nausea, emesis Tolerating PO; limiting certain foods; no issues Incisions are well healed No other complaints   Vital signs: BP (!) 141/73   Pulse 87   Temp 98.5 F (36.9 C) (Oral)   Ht 4\' 11"  (1.499 m)   Wt 204 lb (92.5 kg)   SpO2 96%   BMI 41.20 kg/m    Physical Exam: Constitutional: Well appearing female, NAD Abdomen: Soft, non-tender, non-distended, no rebound/guarding Skin: Laparoscopic incisions are healing well, no erythema or drainage   Assessment/Plan: This is a 62 y.o. female ~1 month s/p robotic assisted laparoscopic cholecystectomy for acute cholecystitis as well as umbilical hernia repair with Dr Everlene Farrier    - Pain control prn  - Reviewed wound care recommendation  - Reviewed lifting restrictions; she has completed these work note given  - She can follow up on as needed basis; She understands to call with questions/concerns  -- [name], PA-C [facility] 04/18/2023, 3:13 PM M-F: 7am - 4pm

## 2023-04-18 NOTE — Patient Instructions (Signed)

## 2023-10-29 ENCOUNTER — Other Ambulatory Visit: Payer: Self-pay | Admitting: Internal Medicine

## 2023-10-29 DIAGNOSIS — Z1231 Encounter for screening mammogram for malignant neoplasm of breast: Secondary | ICD-10-CM

## 2023-11-12 LAB — EXTERNAL GENERIC LAB PROCEDURE: COLOGUARD: NEGATIVE

## 2023-11-14 ENCOUNTER — Encounter

## 2023-11-18 ENCOUNTER — Ambulatory Visit
Admission: RE | Admit: 2023-11-18 | Discharge: 2023-11-18 | Disposition: A | Discharge status: Home / Self Care | Payer: Self-pay | Source: Ambulatory Visit | Attending: Internal Medicine | Admitting: Internal Medicine

## 2023-11-18 DIAGNOSIS — Z1231 Encounter for screening mammogram for malignant neoplasm of breast: Secondary | ICD-10-CM | POA: Diagnosis present
